# Patient Record
Sex: Female | Born: 1985 | Hispanic: Yes | Marital: Single | Smoking: Never smoker
Health system: Southern US, Community
[De-identification: ages and names within clinical notes are randomized; demographics above are authoritative.]

## PROBLEM LIST (undated history)

## (undated) DIAGNOSIS — T7840XA Allergy, unspecified, initial encounter: Secondary | ICD-10-CM

## (undated) DIAGNOSIS — D649 Anemia, unspecified: Secondary | ICD-10-CM

## (undated) DIAGNOSIS — N926 Irregular menstruation, unspecified: Secondary | ICD-10-CM

## (undated) DIAGNOSIS — A64 Unspecified sexually transmitted disease: Secondary | ICD-10-CM

## (undated) DIAGNOSIS — L659 Nonscarring hair loss, unspecified: Secondary | ICD-10-CM

## (undated) HISTORY — DX: Allergy, unspecified, initial encounter: T78.40XA

## (undated) HISTORY — DX: Anemia, unspecified: D64.9

## (undated) HISTORY — DX: Nonscarring hair loss, unspecified: L65.9

## (undated) HISTORY — DX: Unspecified sexually transmitted disease: A64

## (undated) HISTORY — DX: Irregular menstruation, unspecified: N92.6

---

## 2010-05-01 ENCOUNTER — Emergency Department: Payer: Self-pay | Admitting: Internal Medicine

## 2011-12-14 ENCOUNTER — Ambulatory Visit (INDEPENDENT_AMBULATORY_CARE_PROVIDER_SITE_OTHER): Payer: BC Managed Care – PPO | Admitting: Physician Assistant

## 2011-12-14 VITALS — BP 116/72 | HR 65 | Temp 98.1°F | Resp 16 | Ht 64.0 in | Wt 145.0 lb

## 2011-12-14 DIAGNOSIS — Z111 Encounter for screening for respiratory tuberculosis: Secondary | ICD-10-CM

## 2011-12-14 DIAGNOSIS — Z0000 Encounter for general adult medical examination without abnormal findings: Secondary | ICD-10-CM

## 2011-12-14 LAB — POCT CBC
Granulocyte percent: 61.5 %G (ref 37–80)
HCT, POC: 43.4 % (ref 37.7–47.9)
Hemoglobin: 13.4 g/dL (ref 12.2–16.2)
Lymph, poc: 3.2 (ref 0.6–3.4)
MCH, POC: 28.3 pg (ref 27–31.2)
MCHC: 30.9 g/dL — AB (ref 31.8–35.4)
MCV: 91.6 fL (ref 80–97)
MID (cbc): 0.5 (ref 0–0.9)
MPV: 9 fL (ref 0–99.8)
POC Granulocyte: 5.9 (ref 2–6.9)
POC LYMPH PERCENT: 32.9 %L (ref 10–50)
POC MID %: 5.6 %M (ref 0–12)
Platelet Count, POC: 476 10*3/uL — AB (ref 142–424)
RBC: 4.74 M/uL (ref 4.04–5.48)
RDW, POC: 14 %
WBC: 9.6 10*3/uL (ref 4.6–10.2)

## 2011-12-14 NOTE — Progress Notes (Signed)
PPD Placement note  Krista [name], 26 y.o. female is here today for placement of PPD test  Reason for PPD test: pre-employment  1. Have you ever had a TB Skin Test?: yes  2. Have you had a TB Skin Test in the past 6 weeks? no  If so, Date of Test n/a  3. Have you ever had a positive reaction? no  If yes, were you treated with INH? not applicable   4. Have you ever had a BCG vaccine? no  5. Have you ever had Tuberculosis? no  6. Have you been in recent contact with anyone known or suspected of having active TB disease?: no      Date of exposure (if applicable): n/a       Name of person they were exposed to (if applicable): n/a  Patient's Country of origin?: n/a  7. Have you ever been advised by a healthcare provider not to have a TB skin test? no  8. Are yout taking any oral or IV steroid medication now or have you taken it in the last month? no  Verified in allergy area and with patient that the patient is not allergic to the products PPD is made of (Phenol or Tween). Yes   PPD placed on 12/14/2011 at 6:09 PM by Faythe Ghee, CMA. Patient advised to return for reading within 48-72 hours.

## 2011-12-14 NOTE — Progress Notes (Signed)
  Subjective:    Patient ID: Krista [name], female    DOB: Jul 22, 1985, 26 y.o.   MRN: [id number]  HPI 26 year old here for CPE and form completion. 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 She is a healthy female with no known medical problems. No daily medications. She has not had a physical or been to the doctor in "years."  Also interested in being referred to gynecology for annual pap testing.  No concerns or complaints today.     Review of Systems  All other systems reviewed and are negative.       Objective:   Physical Exam  Constitutional: She is oriented to person, place, and time. She appears well-developed and well-nourished.  HENT:  Head: Normocephalic and atraumatic.  Right Ear: Hearing, tympanic membrane, external ear and ear canal normal.  Left Ear: Hearing, tympanic membrane, external ear and ear canal normal.  Mouth/Throat: Uvula is midline, oropharynx is clear and moist and mucous membranes are normal.  Eyes: Conjunctivae normal are normal.  Neck: Normal range of motion. No thyromegaly present.  Cardiovascular: Normal rate, regular rhythm and normal heart sounds.   Pulmonary/Chest: Effort normal and breath sounds normal.  Abdominal: Soft. Bowel sounds are normal. There is no tenderness. There is no rebound and no guarding.  Musculoskeletal: Normal range of motion.  Lymphadenopathy:    She has no cervical adenopathy.  Neurological: She is alert and oriented to person, place, and time.  Psychiatric: She has a normal mood and affect. Her behavior is normal. Judgment and thought content normal.          Assessment & Plan:   1. Routine general medical examination at a health care facility  POCT CBC, Comprehensive metabolic panel, Lipid panel, TSH  2. Screening for tuberculosis  TB Skin Test   TB test placed. Return in 48-72 hours for TB read Refer to GYN for annual pap testing Labwork sent

## 2011-12-14 NOTE — Progress Notes (Deleted)
PPD Placement note  Krista [name], 26 y.o. female is here today for placement of PPD test  Reason for PPD test: pre-employment  1. Have you ever had a TB Skin Test?: yes  2. Have you had a TB Skin Test in the past 6 weeks? {yes no:314532}  If so, Date of Test {NA AND WILDCARD:21589}  3. Have you ever had a positive reaction? {yes no:314532}  If yes, were you treated with INH? {Response; yes/no/na:63}   4. Have you ever had a BCG vaccine? {yes no:314532}  5. Have you ever had Tuberculosis? {yes no:314532}  6. Have you been in recent contact with anyone known or suspected of having active TB disease?: {yes no:314489}      Date of exposure (if applicable): {NA AND ZOXWRUEA:54098}       Name of person they were exposed to (if applicable): {NA AND JXBJYNWG:95621}  Patient's Country of origin?: {NA AND WILDCARD:21589}  7. Have you ever been advised by a healthcare provider not to have a TB skin test? {YES NO:22349}  8. Are yout taking any oral or IV steroid medication now or have you taken it in the last month? {yes HY:865784}  Verified in allergy area and with patient that the patient is not allergic to the products PPD is made of (Phenol or Tween). {yes no:315493::"Yes"}   PPD placed on 12/14/2011 at 5:54 PM. Patient advised to return for reading within 48-72 hours.

## 2011-12-15 LAB — COMPREHENSIVE METABOLIC PANEL
ALT: 8 U/L (ref 0–35)
AST: 10 U/L (ref 0–37)
Albumin: 4.7 g/dL (ref 3.5–5.2)
Alkaline Phosphatase: 78 U/L (ref 39–117)
BUN: 18 mg/dL (ref 6–23)
CO2: 23 mEq/L (ref 19–32)
Calcium: 9.5 mg/dL (ref 8.4–10.5)
Chloride: 104 mEq/L (ref 96–112)
Creat: 0.72 mg/dL (ref 0.50–1.10)
Glucose, Bld: 93 mg/dL (ref 70–99)
Potassium: 4.2 mEq/L (ref 3.5–5.3)
Sodium: 139 mEq/L (ref 135–145)
Total Bilirubin: 0.4 mg/dL (ref 0.3–1.2)
Total Protein: 7.6 g/dL (ref 6.0–8.3)

## 2011-12-15 LAB — TSH: TSH: 1.494 u[IU]/mL (ref 0.350–4.500)

## 2011-12-15 LAB — LIPID PANEL
Cholesterol: 121 mg/dL (ref 0–200)
HDL: 53 mg/dL (ref >39)
LDL Cholesterol: 56 mg/dL (ref 0–99)
Total CHOL/HDL Ratio: 2.3 Ratio
Triglycerides: 60 mg/dL (ref <150)
VLDL: 12 mg/dL (ref 0–40)

## 2011-12-16 ENCOUNTER — Ambulatory Visit (INDEPENDENT_AMBULATORY_CARE_PROVIDER_SITE_OTHER): Payer: BC Managed Care – PPO | Admitting: Radiology

## 2011-12-16 DIAGNOSIS — Z111 Encounter for screening for respiratory tuberculosis: Secondary | ICD-10-CM

## 2011-12-16 LAB — TB SKIN TEST
Induration: 0 mm
TB Skin Test: NEGATIVE

## 2012-07-27 ENCOUNTER — Encounter: Payer: Self-pay | Admitting: Obstetrics and Gynecology

## 2012-07-27 ENCOUNTER — Ambulatory Visit (INDEPENDENT_AMBULATORY_CARE_PROVIDER_SITE_OTHER): Payer: PRIVATE HEALTH INSURANCE | Admitting: Obstetrics and Gynecology

## 2012-07-27 VITALS — BP 100/70 | Ht 63.0 in | Wt 141.0 lb

## 2012-07-27 DIAGNOSIS — Z113 Encounter for screening for infections with a predominantly sexual mode of transmission: Secondary | ICD-10-CM

## 2012-07-27 DIAGNOSIS — Z23 Encounter for immunization: Secondary | ICD-10-CM

## 2012-07-27 DIAGNOSIS — Z01419 Encounter for gynecological examination (general) (routine) without abnormal findings: Secondary | ICD-10-CM

## 2012-07-27 DIAGNOSIS — N852 Hypertrophy of uterus: Secondary | ICD-10-CM

## 2012-07-27 DIAGNOSIS — Z0000 Encounter for general adult medical examination without abnormal findings: Secondary | ICD-10-CM

## 2012-07-27 LAB — POCT URINALYSIS DIPSTICK
Bilirubin, UA: NEGATIVE
Leukocytes, UA: NEGATIVE
Nitrite, UA: NEGATIVE
Protein, UA: NEGATIVE
pH, UA: 5

## 2012-07-27 MED ORDER — NORETHIN ACE-ETH ESTRAD-FE 1-20 MG-MCG PO TABS: 1.0000 | ORAL_TABLET | Freq: Every day | ORAL | Status: DC

## 2012-07-27 NOTE — Progress Notes (Signed)
Patient ID: Krista [name], female   DOB: 04-Mar-1986, 27 y.o.   MRN: [id number] 27 y.o.   Single    Hispanic   female   G0P0   here for annual exam.   Wants birth control.   Used condoms in the past.  Monogamous relationship. Was in previous long term relationship for 8 years and did not get pregnant despite the fact that she did not always use condoms.  No problems with menses.  First 2 days heavier with cramps. Changes every 2 - 3 hours.   Has a PCP at [facility]  Had physcial exam in September 2013 - normal.  Had normal blood work.  Patient's last menstrual period was 07/04/2012.          Sexually active: yes  The current method of family planning is condoms sometimes.    Exercising: cardio and weight lifting Last mammogram: never   Last pap smear: never History of abnormal pap: n/a Smoking:no Alcohol: 2 alcoholic drinks per week Last colonoscopy: never Last Bone Density: never  Last tetanus shot:2012 Did not have Gardisil vaccine.  Last cholesterol check: last year.    Hgb:                Urine: neg   Family History  Problem Relation Age of Onset  . Diabetes Maternal Grandfather     There are no active problems to display for this patient.   PMH - Anemia.  History reviewed. No pertinent past surgical history.  Allergies: Shellfish allergy  No current outpatient prescriptions on file.   No current facility-administered medications for this visit.    ROS: Pertinent items are noted in HPI.  Social Hx:  Single.  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  Lives with mom in [place].  Exam:    BP 100/70  Ht 5\' 3"  (1.6 m)  Wt 141 lb (63.957 kg)  BMI 24.98 kg/m2  LMP 07/04/2012   Wt Readings from Last 3 Encounters:  07/27/12 141 lb (63.957 kg)  12/14/11 145 lb (65.772 kg)     Ht Readings from Last 3 Encounters:  07/27/12 5\' 3"  (1.6 m)  12/14/11 5\' 4"  (1.626 m)    General appearance: alert, cooperative and appears stated age Head: Normocephalic, without obvious  abnormality, atraumatic Neck: no adenopathy, supple, symmetrical, trachea midline and thyroid not enlarged, symmetric, no tenderness/mass/nodules.  Small 1.0 cm nontender left cervical node. Lungs: clear to auscultation bilaterally Breasts: Inspection negative, No nipple retraction or dimpling, No nipple discharge or bleeding, No axillary or supraclavicular adenopathy, Normal to palpation without dominant masses Heart: regular rate and rhythm Abdomen: soft, non-tender; bowel sounds normal; no masses,  no organomegaly Extremities: extremities normal, atraumatic, no cyanosis or edema Skin: Skin color, texture, turgor normal. No rashes or lesions Lymph nodes: Cervical, supraclavicular, and axillary nodes normal. No abnormal inguinal nodes palpated Neurologic: Grossly normal   Pelvic: External genitalia:  no lesions              Urethra:  normal appearing urethra with no masses, tenderness or lesions              Bartholins and Skenes: normal                 Vagina: normal appearing vagina with normal color and discharge, no lesions              Cervix: normal appearance              Pap taken: yes and reflex  HPV testing.        Bimanual Exam:  Uterus:  uterus is globular and 7 week size, nontender                                      Adnexa: normal adnexa in size, nontender and no masses                                      Rectovaginal: Confirms                                      Anus:  normal sphincter tone, no lesions  A:  Enlarged uterus.  I suspect fibroids. No prior HPV vaccine. No prior pap. Need for contraception.     P: Taught breast self awareness. Pap and reflex HPV. STD testing - HIV, RPR, Hep C, and HBsAg, GC/CT Discussed safe sex Gardasil vaccine series. LoEstrin 1/20.  Patient instructed in use and side effects/risks/benefits. Return for pelvic ultrasound return annually or prn     An After Visit Summary was printed and given to the patient.

## 2012-07-27 NOTE — Patient Instructions (Addendum)
EXERCISE AND DIET:  We recommended that you start or continue a regular exercise program for good health. Regular exercise means any activity that makes your heart beat faster and makes you sweat.  We recommend exercising at least 30 minutes per day at least 3 days a week, preferably 4 or 5.  We also recommend a diet low in fat and sugar.  Inactivity, poor dietary choices and obesity can cause diabetes, heart attack, stroke, and kidney damage, among others.    ALCOHOL AND SMOKING:  Women should limit their alcohol intake to no more than 7 drinks/beers/glasses of wine (combined, not each!) per week. Moderation of alcohol intake to this level decreases your risk of breast cancer and liver damage. And of course, no recreational drugs are part of a healthy lifestyle.  And absolutely no smoking or even second hand smoke. Most people know smoking can cause heart and lung diseases, but did you know it also contributes to weakening of your bones? Aging of your skin?  Yellowing of your teeth and nails?  CALCIUM AND VITAMIN D:  Adequate intake of calcium and Vitamin D are recommended.  The recommendations for exact amounts of these supplements seem to change often, but generally speaking 600 mg of calcium (either carbonate or citrate) and 800 units of Vitamin D per day seems prudent. Certain women may benefit from higher intake of Vitamin D.  If you are among these women, your doctor will have told you during your visit.    PAP SMEARS:  Pap smears, to check for cervical cancer or precancers,  have traditionally been done yearly, although recent scientific advances have shown that most women can have pap smears less often.  However, every woman still should have a physical exam from her gynecologist every year. It will include a breast check, inspection of the vulva and vagina to check for abnormal growths or skin changes, a visual exam of the cervix, and then an exam to evaluate the size and shape of the uterus and  ovaries.  And after 25 years of age, a rectal exam is indicated to check for rectal cancers. We will also provide age appropriate advice regarding health maintenance, like when you should have certain vaccines, screening for sexually transmitted diseases, bone density testing, colonoscopy, mammograms, etc.   MAMMOGRAMS:  All women over 40 years old should have a yearly mammogram. Many facilities now offer a "3D" mammogram, which may cost around $50 extra out of pocket. If possible,  we recommend you accept the option to have the 3D mammogram performed.  It both reduces the number of women who will be called back for extra views which then turn out to be normal, and it is better than the routine mammogram at detecting truly abnormal areas.    COLONOSCOPY:  Colonoscopy to screen for colon cancer is recommended for all women at age 50.  We know, you hate the idea of the prep.  We agree, BUT, having colon cancer and not knowing it is worse!!  Colon cancer so often starts as a polyp that can be seen and removed at colonscopy, which can quite literally save your life!  And if your first colonoscopy is normal and you have no family history of colon cancer, most women don't have to have it again for 10 years.  Once every ten years, you can do something that may end up saving your life, right?  We will be happy to help you get it scheduled when you are ready.    Be sure to check your insurance coverage so you understand how much it will cost.  It may be covered as a preventative service at no cost, but you should check your particular policy.     Oral Contraception Information Oral contraceptives (OCs) are medicines taken to prevent pregnancy. OCs work by preventing the ovaries from releasing eggs. The hormones in OCs also cause the cervical mucus to thicken, preventing the sperm from entering the uterus. The hormones also cause the uterine lining to become thin, not allowing a fertilized egg to attach to the inside of  the uterus. OCs are highly effective when taken exactly as prescribed. However, OCs do not prevent sexually transmitted diseases (STDs). Safe sex practices, such as using condoms along with the pill, can help prevent STDs.  Before taking the pill, you may have a physical exam and Pap test. Your caregiver may order blood tests that may be necessary. Your caregiver will make sure you are a good candidate for oral contraception. Discuss with your caregiver the possible side effects of the OC you may be prescribed. When starting an OC, it can take 2 to 3 months for the body to adjust to the changes in hormone levels in your body.  TYPES OF ORAL CONTRACEPTION  The combination pill. This pill contains estrogen and progestin (synthetic progesterone) hormones. The combination pill comes in either 21-day or 28-day packs. With 21-day packs, you do not take pills for 7 days after the last pill. With 28-day packs, the pill is taken every day. The last 7 pills are without hormones. Certain types of pills have more than 21 hormone-containing pills.  The minipill. This pill contains the progesterone hormone only. It is taken every day continuously. The minipill comes in packs of 91 pills. The first 84 pills contain the hormones, and the last 7 pills do not. The last 7 days are when you will have your menstrual period. You may experience irregular spotting. ADVANTAGES  Decreases premenstrual symptoms.  Treats menstrual period cramps.  Regulates the menstrual cycle.  Decreases a heavy menstrual flow.  Treats acne.  Treats abnormal uterine bleeding.  Treats chronic pelvic pain.  Treats polycystic ovarian syndrome.  Treats endometriosis.  Can be used as emergency contraception. DISADVANTAGES OCs can be less effective if:  You forget to take the pill at the same time every day.  You have a stomach or intestinal disease that lessens the absorption of the pill.  You take OCs with other medicines that  make OCs less effective.  You take expired OCs.  You forget to restart the pill on day 7, when using the packs of 21 pills. Document Released: 05/22/2002 Document Revised: 05/24/2011 Document Reviewed: 07/08/2010 [facility] Patient Information 2013 [place], [place]. HPV Vaccine Questions and Answers WHAT IS HUMAN PAPILLOMAVIRUS (HPV)? HPV is a virus that can lead to cervical cancer; vulvar and vaginal cancers; penile cancer; anal cancer and genital warts (warts in the genital areas). More than 1 vaccine is available to help you or your child with protection against HPV. Your caregiver can talk to you about which one might give you the best protection. WHO SHOULD GET THIS VACCINE? The HPV vaccine is most effective when given before the onset of sexual activity.  This vaccine is recommended for girls 82 or 25 years of age. It can be given to girls as young as 25 years old.  HPV vaccine can be given to males, 9 through 25 years of age, to reduce the likelihood of acquiring  genital warts.  HPV vaccine can be given to males and females aged 6 through 26 years to prevent anal cancer. HPV vaccine is not generally recommended after age 32, because most individuals have been exposed to the HPV virus by that age. HOW EFFECTIVE IS THIS VACCINE?  The vaccine is generally effective in preventing cervical; vulvar and vaginal cancers; penile cancer; anal cancer and genital warts caused by 4 types of HPV. The vaccine is less effective in those individuals who are already infected with HPV. This vaccine does not treat existing HPV, genital warts, pre-cancers or cancers. WILL SEXUALLY ACTIVE INDIVIDUALS BENEFIT FROM THE VACCINE? Sexually active individuals may still benefit from the vaccine but may get less benefit due to previous HPV exposure. HOW AND WHEN IS THE VACCINE ADMINISTERED? The vaccine is given in a series of 3 injections (shots) over a 6 month period in both males and females. The exact timing  depends on which specific vaccine your caregiver recommends for you. IS THE HPV VACCINE SAFE?  The federal government has approved the HPV vaccine as safe and effective. This vaccine was tested in both males and females in many countries around the world. The most common side effect is soreness at the injection site. Since the drug became approved, there has been some concern about patients passing out after being vaccinated, which has led to a recommendation of a 15 minute waiting period following vaccination. This practice may decrease the small risk of passing out. Additionally there is a rare risk of anaphylaxis (an allergic reaction) to the vaccine and a risk of a blood clot among individuals with specific risk factors for a blood clot. DOES THIS VACCINE CONTAIN THIMEROSAL OR MERCURY? No. There is no thimerosal or mercury in the HPV vaccine. It is made of proteins from the outer coat of the virus (HPV). There is no infectious material in this vaccine. WILL GIRLS/WOMEN WHO HAVE BEEN VACCINATED STILL NEED CERVICAL CANCER SCREENING? Yes. There are 3 reasons why women will still need regular cervical cancer screening. First, the vaccine will NOT provide protection against all types of HPV that cause cervical cancer. Vaccinated women will still be at risk for some cancers. Second, some women may not get all required doses of the vaccine (or they may not get them at the recommended times). Therefore, they may not get the vaccine's full benefits. Third, women may not get the full benefit of the vaccine if they receive it after they have already acquired any of the 4 types of HPV. WILL THE HPV VACCINE BE COVERED BY INSURANCE PLANS? While some insurance companies may cover the vaccine, others may not. Most large group insurance plans cover the costs of recommended vaccines. WHAT KIND OF GOVERNMENT PROGRAMS MAY BE AVAILABLE TO COVER HPV VACCINE? Federal health programs such as Vaccines for Children [facility]) will  cover the HPV vaccine. The [facility] program provides free vaccines to children and adolescents under 63 years of age, who are either uninsured, Medicaid-eligible, American [place] or [place] Native. There are over 45,000 sites that provide [facility] vaccines including hospital, private and public clinics. The [facility] program also allows children and adolescents to get [facility] vaccines through [facility] if their private health insurance does not cover the vaccine. Some states also provide free or low-cost vaccines, at public health clinics, to people without health insurance coverage for vaccines. GENITAL HPV: WHY IS HPV IMPORTANT? Genital HPV is the most common virus transmitted through genital contact, most  often during vaginal and anal sex. About 40 types of HPV can infect the genital areas of men and women. While most HPV types cause no symptoms and go away on their own, some types can cause cervical cancer in women. These types also cause other less common genital cancers, including cancers of the penis, anus, vagina (birth canal), and vulva (area around the opening of the vagina). Other types of HPV can cause genital warts in men and women. HOW COMMON IS HPV?   At least 50% of sexually active people will get HPV at some time in their lives. HPV is most common in young women and men who are in their late teens and early 72s.  Anyone who has ever had genital contact with another person can get HPV. Both men and women can get it and pass it on to their sex partners without realizing it. IS HPV THE SAME THING AS HIV OR HERPES? HPV is NOT the same as HIV or Herpes (Herpes simplex virus or HSV). While these are all viruses that can be sexually transmitted, HIV and HSV do not cause the same symptoms or health problems as HPV. CAN HPV AND ITS ASSOCIATED DISEASES BE TREATED? There is no treatment for HPV. There are treatments for the health problems that HPV can cause, such as  genital warts, cervical cell changes, and cancers of the cervix (lower part of the womb), vulva, vagina and anus.  HOW IS HPV RELATED TO CERVICAL CANCER? Some types of HPV can infect a woman's cervix and cause the cells to change in an abnormal way. Most of the time, HPV goes away on its own. When HPV is gone, the cervical cells go back to normal. Sometimes, HPV does not go away. Instead, it lingers (persists) and continues to change the cells on a woman's cervix. These cell changes can lead to cancer over time if they are not treated. ARE THERE OTHER WAYS TO PREVENT CERVICAL CANCER? Regular Pap tests and follow-up can prevent most, but not all, cases of cervical cancer. Pap tests can detect cell changes (or pre-cancers) in the cervix before they turn into cancer. Pap tests can also detect most, but not all, cervical cancers at an early, curable stage. Most women diagnosed with cervical cancer have either never had a Pap test, or not had a Pap test in the last 5 years. There is also an HPV DNA test available for use with the Pap test as part of cervical cancer screening. This test may be ordered for women over 30 or for women who get an unclear (borderline) Pap test result. While this test can tell if a woman has HPV on her cervix, it cannot tell which types of HPV she has. If the HPV DNA test is negative for HPV DNA, then screening may be done every 3 years. If the HPV DNA test is positive for HPV DNA, then screening should be done every 6 to 12 months. OTHER QUESTIONS ABOUT THE HPV VACCINE WHAT HPV TYPES DOES THE VACCINE PROTECT AGAINST? The HPV vaccine protects against the HPV types that cause most (70%) cervical cancers (types 16 and 18), most (78%) anal cancers (types 16 and 18) and the two HPV types that cause most (90%) genital warts (types 6 and 11). WHAT DOES THE VACCINE NOT PROTECT AGAINST?  Because the vaccine does not protect against all types of HPV, it will not prevent all cases of cervical  cancer, anal cancer, other genital cancers or genital warts. About  30% of cervical cancers are not prevented with vaccination, so it will be important for women to continue screening for cervical cancer (regular Pap tests). Also, the vaccine does not prevent about 10% of genital warts nor will it prevent other sexually transmitted infections (STIs), including HIV. Therefore, it will still be important for sexually active adults to practice safe sex to reduce exposure to HPV and other STI's. HOW LONG DOES VACCINE PROTECTION LAST? WILL A BOOSTER SHOT BE NEEDED? So far, studies have followed women for 5 years and found that they are still protected. Currently, additional (booster) doses are not recommended. More research is being done to find out how long protection will last, and if a booster vaccine is needed years later.  WHY IS THE HPV VACCINE RECOMMENDED AT SUCH A YOUNG AGE? Ideally, males and females should get the vaccine before they are sexually active since this vaccine is most effective in individuals who have not yet acquired any of the HPV vaccine types. Individuals who have not been infected with any of the 4 types of HPV will get the full benefits of the vaccine.  SHOULD PREGNANT WOMEN BE VACCINATED? The vaccine is not recommended for pregnant women. There has been limited research looking at vaccine safety for pregnant women and their developing fetus. Studies suggest that the vaccine has not caused health problems during pregnancy, nor has it caused health problems for the infant. Pregnant women should complete their pregnancy before getting the vaccine. If a woman finds out she is pregnant after she has started getting the vaccine series, she should complete her pregnancy before finishing the 3 doses. SHOULD BREASTFEEDING MOTHERS BE VACCINATED? Mothers nursing their babies may get the vaccine because the virus is inactivated and will not harm the mother or baby. WILL INDIVIDUALS BE PROTECTED  AGAINST HPV AND RELATED DISEASES, EVEN IF THEY DO NOT GET ALL 3 DOSES? It is not yet known how much protection individuals will get from receiving only 1 or 2 doses of the vaccine. For this reason, it is very important that individuals get all 3 doses of the vaccine. WILL CHILDREN BE REQUIRED TO BE VACCINATED TO ENTER SCHOOL? There are no federal laws that require children or adolescents to get vaccinated. All school entry laws are state laws so they vary from state to state. To find out what vaccines are needed for children or adolescents to enter school in your state, check with your state health department or board of education. ARE THERE OTHER WAYS TO PREVENT HPV? The only sure way to prevent HPV is to abstain from all sexual activity. Sexually active adults can reduce their risk by being in a mutually monogamous relationship with someone who has had no other sex partners. But even individuals with only 1 lifetime sex partner can get HPV, if their partner has had a previous partner with HPV. It is unknown how much protection condoms provide against HPV, since areas that are not covered by a condom can be exposed to the virus. However, condoms may reduce the risk of genital warts and cervical cancer. They can also reduce the risk of HIV and some other sexually transmitted infections (STIs), when used consistently and correctly (all the time and the right way). Document Released: 03/01/2005 Document Revised: 05/24/2011 Document Reviewed: 10/25/2008 [facility] Patient Information 2013 [place], [place].

## 2012-07-28 ENCOUNTER — Encounter: Payer: Self-pay | Admitting: Obstetrics and Gynecology

## 2012-07-28 NOTE — Progress Notes (Signed)
Patient tolerated injection well.

## 2012-08-10 ENCOUNTER — Telehealth: Payer: Self-pay | Admitting: Obstetrics and Gynecology

## 2012-08-14 ENCOUNTER — Telehealth: Payer: Self-pay | Admitting: Obstetrics and Gynecology

## 2012-08-14 NOTE — Telephone Encounter (Signed)
Scheduled patient for PUS. 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 Please call the patient.

## 2012-08-15 NOTE — Telephone Encounter (Signed)
Spoke with pt about HPV result. 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 She has mild cellular changes that do not require any further treatment except to have a Pap next year. Advised her body would most likely fight off the infection on its own. Pt appreciative.

## 2012-08-29 NOTE — Telephone Encounter (Signed)
Unable to leave message in CB#VM box not set up.

## 2012-08-29 NOTE — Telephone Encounter (Signed)
Patient has an ultrasound appointment tomorrow but has started her cycle and needs to know if she should keep the appointment or reschedule?

## 2012-08-30 ENCOUNTER — Encounter: Payer: Self-pay | Admitting: Obstetrics and Gynecology

## 2012-08-30 ENCOUNTER — Ambulatory Visit (INDEPENDENT_AMBULATORY_CARE_PROVIDER_SITE_OTHER): Payer: PRIVATE HEALTH INSURANCE | Admitting: Obstetrics and Gynecology

## 2012-08-30 ENCOUNTER — Ambulatory Visit (INDEPENDENT_AMBULATORY_CARE_PROVIDER_SITE_OTHER): Payer: PRIVATE HEALTH INSURANCE

## 2012-08-30 VITALS — BP 104/60 | HR 60 | Ht 63.0 in | Wt 140.0 lb

## 2012-08-30 DIAGNOSIS — R6889 Other general symptoms and signs: Secondary | ICD-10-CM

## 2012-08-30 DIAGNOSIS — N852 Hypertrophy of uterus: Secondary | ICD-10-CM

## 2012-08-30 DIAGNOSIS — N921 Excessive and frequent menstruation with irregular cycle: Secondary | ICD-10-CM

## 2012-08-30 DIAGNOSIS — IMO0002 Reserved for concepts with insufficient information to code with codable children: Secondary | ICD-10-CM

## 2012-08-30 NOTE — Patient Instructions (Signed)
Abnormal Pap Test Information During a Pap test, the cells on the surface of your cervix are checked to see if they look normal, abnormal, or if they show signs of having been altered by a certain type of virus called human papillomavirus, or HPV. Cervical cells that have been affected by HPV are called dysplasia. Dysplasia is not cancer, but describes abnormal cells found on the surface of the cervix. Depending on the degree of dysplasia, some of the cells may be considered pre-cancerous and may turn into cancer over time if follow up with a caregiver is delayed.  WHAT DOES AN ABNORMAL PAP TEST MEAN? Having an abnormal pap test does not mean that you have cancer. However, certain types of abnormal pap tests can be a sign that a person is at a higher risk of developing cancer. Your caregiver will want to do other tests to find out more about the abnormal cells. Your abnormal Pap test results could show:   Small and uncertain changes that should be carefully watched.   Cervical dysplasia that has caused mild changes and can be followed over time.  Cervical dysplasia that is more severe and needs to be followed and treated to ensure the problem goes away.  Cancer.  When severe cervical dysplasia is found and treated early, it rarely will grow into cancer.  WHAT WILL BE DONE ABOUT MY ABNORMAL PAP TEST?  A colposcopy may be needed. This is a procedure where your cervix is examined using light and magnification.  A small tissue sample of your cervix (biopsy) may need to be removed and then examined. This is often performed if there are areas that appear infected.  A sample of cells from the cervical canal may be removed with either a small brush or scraping instrument (curette). Based on the results of the procedures above, some caregivers may recommend either cryotherapy of the cervix or a surgical LEEP where a portion of the cervix is removed. LEEP is short for "loop electrical excisional  procedure." Rarely, a caregiver may recommend a cone biopsy.This is a procedure where a small, cone-shaped sample of your cervix is taken out. The part that is taken out is the area where the abnormal cells are.  WHAT IF I HAVE A DYSPLASIA OR A CANCER? You may be referred to a specialist. Radiation may also be a treatment for more advanced cancer. Having a hysterectomy is the last treatment option for dysplasia, but it is a more common treatment for someone with cancer. All treatment options will be discussed with you by your caregiver. WHAT SHOULD YOU DO AFTER BEING TREATED? If you have had an abnormal pap test, you should continue to have regular pap tests and check-ups as directed by your caregiver. Your cervical problem will be carefully watched so it does not get worse. Also, your caregiver can watch for, and treat, any new problems that may come up. Document Released: 06/16/2010 Document Revised: 05/24/2011 Document Reviewed: 02/25/2011 [employer] Patient Information 2014 [employer].  

## 2012-08-30 NOTE — Progress Notes (Signed)
Subjective  Here for ultrasound today for follow up of enlarged uterus noted on routine pelvic exam at time of annual visit.  Having break through bleeding on LoEstrin 1/20.  Generally happy with being on the OCPS.  No missed pills.  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  Objective  Normal pelvic ultrasound noted.  No uterine or andexal masses seen.    Assessment  Normal pelvic ultrasound. Breakthrough bleeding on LoEstrin 1/20. LGSIL pap.  Plan  Reassurance regarding pelvic ultrasound results. Monitor bleeding on OCPs.  If persists after three months, will switch to LoEstrin 1.5/30. Discussion with patient regarding HPV, Gardasil, and abnormal pap smears. Next Gardasil in one month. Pap repeat in one year.  After visit summary to patient.

## 2012-09-29 ENCOUNTER — Ambulatory Visit (INDEPENDENT_AMBULATORY_CARE_PROVIDER_SITE_OTHER): Payer: PRIVATE HEALTH INSURANCE | Admitting: *Deleted

## 2012-09-29 VITALS — BP 110/74 | HR 72 | Ht 63.0 in | Wt 144.0 lb

## 2012-09-29 DIAGNOSIS — Z23 Encounter for immunization: Secondary | ICD-10-CM

## 2012-09-29 NOTE — Progress Notes (Signed)
Pt arrived for 2nd Gardasil injection.  Pt had no complaints after 1st injection.  Pt tolerated injection well in left deltoid.  She will return in November 2014 for third and final injection.

## 2012-12-13 DIAGNOSIS — A64 Unspecified sexually transmitted disease: Secondary | ICD-10-CM

## 2012-12-13 HISTORY — DX: Unspecified sexually transmitted disease: A64

## 2013-01-10 ENCOUNTER — Encounter: Payer: Self-pay | Admitting: Obstetrics and Gynecology

## 2013-01-10 ENCOUNTER — Ambulatory Visit: Payer: PRIVATE HEALTH INSURANCE | Admitting: Obstetrics and Gynecology

## 2013-01-10 DIAGNOSIS — B9689 Other specified bacterial agents as the cause of diseases classified elsewhere: Secondary | ICD-10-CM

## 2013-01-10 DIAGNOSIS — N760 Acute vaginitis: Secondary | ICD-10-CM

## 2013-01-10 DIAGNOSIS — Z113 Encounter for screening for infections with a predominantly sexual mode of transmission: Secondary | ICD-10-CM

## 2013-01-10 DIAGNOSIS — A499 Bacterial infection, unspecified: Secondary | ICD-10-CM

## 2013-01-10 MED ORDER — METRONIDAZOLE 500 MG PO TABS: 500.0000 mg | ORAL_TABLET | Freq: Two times a day (BID) | ORAL | Status: DC

## 2013-01-10 NOTE — Progress Notes (Signed)
Patient ID: Krista [name], female   DOB: [birth year], 27 y.o.   MRN: [id number] GYNECOLOGY PROBLEM VISIT  PCP:  None  Referring provider:   HPI: 27 y.o.   Single  Hispanic  female   G0P0 with LMP10-5-14.   here for light yellow vaginal discharge with odor for one week. No itching or burning.  Tried bubble baths after.  No new exposures prior to this.  New sexual partner. Using condoms.  Stopped birth control pills due to break through bleeding. Declines prescription contraception.   GYNECOLOGIC HISTORY: LMP 12-17-12 Sexually active:  yes Partner preference: female Contraception:   condoms Menopausal hormone therapy: n/a DES exposure:   no Blood transfusions:   no Sexually transmitted diseases:   HPV GYN Procedures:  no Mammogram: n/a                Pap: 07-27-12 LSIL   History of abnormal pap smear:  no   OB History   Grav Para Term Preterm Abortions TAB SAB Ect Mult Living   0                  Family History  Problem Relation Age of Onset  . Diabetes Maternal Grandfather     There are no active problems to display for this patient.   Past Medical History  Diagnosis Date  . Anemia     History reviewed. No pertinent past surgical history.  ALLERGIES: Shellfish allergy  No current outpatient prescriptions on file.   No current facility-administered medications for this visit.     ROS:  Pertinent items are noted in HPI.  SOCIAL HISTORY:    PHYSICAL EXAMINATION:    There were no vitals taken for this visit.   Wt Readings from Last 3 Encounters:  09/29/12 144 lb (65.318 kg)  08/30/12 140 lb (63.504 kg)  07/27/12 141 lb (63.957 kg)     Ht Readings from Last 3 Encounters:  09/29/12 5\' 3"  (1.6 m)  08/30/12 5\' 3"  (1.6 m)  07/27/12 5\' 3"  (1.6 m)    General appearance: alert, cooperative and appears stated age   Pelvic: External genitalia:  no lesions              Urethra:  normal appearing urethra with no masses, tenderness or lesions   Bartholins and Skenes: normal                 Vagina: normal appearing vagina with normal color and discharge, no lesions              Cervix: normal appearance                  Bimanual Exam:  Uterus:  uterus is normal size, shape, consistency and nontender                                      Adnexa: normal adnexa in size, nontender and no masses                                       Wet prep - pH 5.5, positive clue cells, negative yeast and trichomonas.  ASSESSMENT  Bacterial vaginosis. New partner. History of LGSIL pap.    PLAN  Flagyl 500 mg po bid for 7 days.  STD panel. Discussed Plan B for emergency  contraception.  Follow up prn and for annual visit in May 2014, at which time a pap will be performed.   An After Visit Summary was printed and given to the patient.

## 2013-01-10 NOTE — Patient Instructions (Signed)
Bacterial Vaginosis Bacterial vaginosis (BV) is a vaginal infection where the normal balance of bacteria in the vagina is disrupted. The normal balance is then replaced by an overgrowth of certain bacteria. There are several different kinds of bacteria that can cause BV. BV is the most common vaginal infection in women of childbearing age. CAUSES   The cause of BV is not fully understood. BV develops when there is an increase or imbalance of harmful bacteria.  Some activities or behaviors can upset the normal balance of bacteria in the vagina and put women at increased risk including:  Having a new sex partner or multiple sex partners.  Douching.  Using an intrauterine device (IUD) for contraception.  It is not clear what role sexual activity plays in the development of BV. However, women that have never had sexual intercourse are rarely infected with BV. Women do not get BV from toilet seats, bedding, swimming pools or from touching objects around them.  SYMPTOMS   Grey vaginal discharge.  A fish-like odor with discharge, especially after sexual intercourse.  Itching or burning of the vagina and vulva.  Burning or pain with urination.  Some women have no signs or symptoms at all. DIAGNOSIS  Your caregiver must examine the vagina for signs of BV. Your caregiver will perform lab tests and look at the sample of vaginal fluid through a microscope. They will look for bacteria and abnormal cells (clue cells), a pH test higher than 4.5, and a positive amine test all associated with BV.  RISKS AND COMPLICATIONS   Pelvic inflammatory disease (PID).  Infections following gynecology surgery.  Developing HIV.  Developing herpes virus. TREATMENT  Sometimes BV will clear up without treatment. However, all women with symptoms of BV should be treated to avoid complications, especially if gynecology surgery is planned. Female partners generally do not need to be treated. However, BV may spread  between female sex partners so treatment is helpful in preventing a recurrence of BV.   BV may be treated with antibiotics. The antibiotics come in either pill or vaginal cream forms. Either can be used with nonpregnant or pregnant women, but the recommended dosages differ. These antibiotics are not harmful to the baby.  BV can recur after treatment. If this happens, a second round of antibiotics will often be prescribed.  Treatment is important for pregnant women. If not treated, BV can cause a premature delivery, especially for a pregnant woman who had a premature birth in the past. All pregnant women who have symptoms of BV should be checked and treated.  For chronic reoccurrence of BV, treatment with a type of prescribed gel vaginally twice a week is helpful. HOME CARE INSTRUCTIONS   Finish all medication as directed by your caregiver.  Do not have sex until treatment is completed.  Tell your sexual partner that you have a vaginal infection. They should see their caregiver and be treated if they have problems, such as a mild rash or itching.  Practice safe sex. Use condoms. Only have 1 sex partner. PREVENTION  Basic prevention steps can help reduce the risk of upsetting the natural balance of bacteria in the vagina and developing BV:  Do not have sexual intercourse (be abstinent).  Do not douche.  Use all of the medicine prescribed for treatment of BV, even if the signs and symptoms go away.  Tell your sex partner if you have BV. That way, they can be treated, if needed, to prevent reoccurrence. SEEK MEDICAL CARE IF:     Your symptoms are not improving after 3 days of treatment.  You have increased discharge, pain, or fever. MAKE SURE YOU:   Understand these instructions.  Will watch your condition.  Will get help right away if you are not doing well or get worse. FOR MORE INFORMATION  Division of STD Prevention (DSTDP), Centers for Disease Control and Prevention:  www.cdc.gov/std American Social Health Association (ASHA): www.ashastd.org  Document Released: 03/01/2005 Document Revised: 05/24/2011 Document Reviewed: 08/22/2008 ExitCare Patient Information 2014 [employer].  

## 2013-01-11 LAB — HEPATITIS C ANTIBODY: HCV Ab: NEGATIVE

## 2013-01-13 LAB — GC/CHLAMYDIA PROBE AMP, URINE: GC Probe Amp, Urine: NEGATIVE

## 2013-01-18 ENCOUNTER — Telehealth: Payer: Self-pay | Admitting: Obstetrics and Gynecology

## 2013-01-18 ENCOUNTER — Other Ambulatory Visit: Payer: Self-pay

## 2013-01-18 ENCOUNTER — Telehealth: Payer: Self-pay

## 2013-01-18 ENCOUNTER — Other Ambulatory Visit: Payer: Self-pay | Admitting: Obstetrics and Gynecology

## 2013-01-18 MED ORDER — AZITHROMYCIN 500 MG PO TABS: ORAL_TABLET | ORAL | Status: DC

## 2013-01-18 NOTE — Telephone Encounter (Signed)
Message copied by Alphonsa Overall on Thu Jan 18, 2013 10:28 AM ------      Message from: Conley Simmonds      Created: Thu Jan 18, 2013  8:24 AM      Regarding: Please contact patient regarding positive chlamydia test       Marchelle Folks,            Please contact patient regarding her positive chlamydia test.      I will send an Rx to her pharmacy for Azithromycin 1 gram.            Thank you!!!            Brook  ------

## 2013-01-18 NOTE — Telephone Encounter (Signed)
Patient notified of positive Chlamydia culture and advised Dr. Edward Jolly Escribed Azithromycin for her.  She needs to have partner Checked and treated by his physician.  Made 3 month recheck appointment for patient to see Dr. Edward Jolly.

## 2013-01-19 ENCOUNTER — Encounter: Payer: Self-pay | Admitting: Obstetrics and Gynecology

## 2013-01-19 NOTE — Telephone Encounter (Signed)
Patient came to office and was paying bill. Had questions regarding Azithromycin dosage. Wanted to know if just taking two pills were correct. I advised, yes, two pills are what was needed for treatment. I printed out CDC information/fact sheet on treatment for patient. Discussed partner needing to be treated as well. Patient verbalized understanding and will follow up as needed.

## 2013-01-19 NOTE — Progress Notes (Signed)
Patient ID: Krista [name], female   DOB: 1985/03/20, 27 y.o.   MRN: [id number]  Patient came to office to pay bill and had questions regarding new medication.   Patient went to pick up zithromax rx and was wondering if two pills were correct dose.  I advised patient that

## 2013-01-22 NOTE — Telephone Encounter (Signed)
Patient had questions about Azithromycin and discussed this with French Ana our triage nurse on 01/19/13 (see note)

## 2013-01-30 ENCOUNTER — Ambulatory Visit (INDEPENDENT_AMBULATORY_CARE_PROVIDER_SITE_OTHER): Payer: PRIVATE HEALTH INSURANCE | Admitting: *Deleted

## 2013-01-30 VITALS — BP 100/60 | HR 76 | Resp 18 | Wt 145.0 lb

## 2013-01-30 DIAGNOSIS — Z23 Encounter for immunization: Secondary | ICD-10-CM

## 2013-04-19 ENCOUNTER — Encounter: Payer: Self-pay | Admitting: Obstetrics and Gynecology

## 2013-04-19 ENCOUNTER — Ambulatory Visit (INDEPENDENT_AMBULATORY_CARE_PROVIDER_SITE_OTHER): Payer: PRIVATE HEALTH INSURANCE | Admitting: Obstetrics and Gynecology

## 2013-04-19 VITALS — BP 113/73 | HR 65 | Resp 14 | Wt 151.0 lb

## 2013-04-19 DIAGNOSIS — A749 Chlamydial infection, unspecified: Secondary | ICD-10-CM

## 2013-04-19 NOTE — Patient Instructions (Signed)
Please return for your annual examination in May so we can do your repeat pap smear!

## 2013-04-19 NOTE — Progress Notes (Signed)
Patient ID: Krista [name], female   DOB: Jul 07, 1985, 28 y.o.   MRN: [id number]   Subjective  Patient is here for a recheck. Positive chlamydia on 01/10/13. Treated with Azithromycin.  Was able to tolerate.  No abnormal bleeding, pain, discharge, or fever.  Partner treated as well.  Patient had full STD check in October 2014.   History of LGSIL pap in May 2014.  Due for a recheck in May 2015.   Stopped LoEstrin 1/20 for birth control.  Using condoms.  Patient is a Pharmacist, hospital.   Objective  Pelvic examination - Normal external genitalia and urethra. Cervix and vagina no lesions. No CMT. Uterus small and nontender. No adnexal masses or tenderness.  Assessment  Recent positive chlamydia, treated. History of LGSIL.  Plan  Repeat GC/CT from urine. Annual examination in may 2015 for repeat pap. Continue condom use.   15 minutes face to face time of which over 50% was spent in counseling.   After visit summary to the patient.

## 2013-07-30 ENCOUNTER — Ambulatory Visit: Payer: PRIVATE HEALTH INSURANCE | Admitting: Obstetrics and Gynecology

## 2013-07-30 ENCOUNTER — Encounter: Payer: Self-pay | Admitting: Obstetrics and Gynecology

## 2013-07-30 ENCOUNTER — Other Ambulatory Visit: Payer: Self-pay | Admitting: Obstetrics and Gynecology

## 2013-07-30 ENCOUNTER — Ambulatory Visit (INDEPENDENT_AMBULATORY_CARE_PROVIDER_SITE_OTHER): Payer: PRIVATE HEALTH INSURANCE | Admitting: Obstetrics and Gynecology

## 2013-07-30 VITALS — BP 118/70 | HR 74 | Temp 98.6°F | Ht 62.5 in | Wt 154.2 lb

## 2013-07-30 DIAGNOSIS — Z113 Encounter for screening for infections with a predominantly sexual mode of transmission: Secondary | ICD-10-CM

## 2013-07-30 DIAGNOSIS — Z01419 Encounter for gynecological examination (general) (routine) without abnormal findings: Secondary | ICD-10-CM

## 2013-07-30 DIAGNOSIS — Z0000 Encounter for general adult medical examination without abnormal findings: Secondary | ICD-10-CM

## 2013-07-30 LAB — POCT URINALYSIS DIPSTICK
BILIRUBIN UA: NEGATIVE
Blood, UA: 50
GLUCOSE UA: NEGATIVE
KETONES UA: NEGATIVE
Nitrite, UA: NEGATIVE
Protein, UA: NEGATIVE
UROBILINOGEN UA: NEGATIVE
pH, UA: 7

## 2013-07-30 NOTE — Patient Instructions (Signed)

## 2013-07-30 NOTE — Progress Notes (Signed)
Patient ID: Krista [name], female   DOB: [birth year], 28 y.o.   MRN: [id number] GYNECOLOGY VISIT  PCP:   None  Referring provider:   HPI: 28 y.o.   Single  Hispanic  female   G0P0 with Patient's last menstrual period was 07/15/2013.   here for   AEX. Needs pap and HR HPV testing.  Had LGSIL pap last year.   Menses are normal off OCPs. Heavier bleeding and cramping the first day of her cycle.  Takes Tylenol or Advil and this works well.   Satisfied with condom use.  Same partner for 9 months.  Considering future pregnancy.  Wants full STD testing.  Declines general blood work.   Normal pelvic ultrasound last year done for enlarged uterus on pelvic exam.   Urine:    GYNECOLOGIC HISTORY: Patient's last menstrual period was 07/15/2013. Sexually active: yes  Partner preference: female Contraception: condoms everytime   Menopausal hormone therapy: no DES exposure: no Blood transfusions: no   Sexually transmitted diseases:  Tx'd for Chlamydia 12/2012  GYN procedures and prior surgeries:  no Last mammogram:   n/a              Last pap and high risk HPV testing: LSIL on pap 07-27-12--needs repeat pap in one year   History of abnormal pap smear:  LSIL on pap 07-27-12 and needs repeat pap 07/2013.   OB History   Grav Para Term Preterm Abortions TAB SAB Ect Mult Living   0                LIFESTYLE: Exercise:   Cardio.          Tobacco:    Alcohol:      Two mix drinks per week Drug use:   no  OTHER HEALTH MAINTENANCE: Tetanus/TDap:    2013 Gardisil:                Completed Gardasil 07/2012 Influenza:              never Zostavax:              n/a  Bone density:        n/a Colonoscopy:        n/a  Cholesterol check:   unsure   Family History  Problem Relation Age of Onset  . Diabetes Maternal Grandfather     There are no active problems to display for this patient.  Past Medical History  Diagnosis Date  . Anemia   . STD (sexually transmitted disease) 12/2012     + Chlam    History reviewed. No pertinent past surgical history.  ALLERGIES: Shellfish allergy  No current outpatient prescriptions on file.   No current facility-administered medications for this visit.     ROS:  Pertinent items are noted in HPI.  SOCIAL HISTORY:  Lifespan.   Is a Pharmacist, hospital.   PHYSICAL EXAMINATION:    BP 118/70  Pulse 74  Temp(Src) 98.6 F (37 C)  Ht 5' 2.5" (1.588 m)  Wt 154 lb 3.2 oz (69.945 kg)  BMI 27.74 kg/m2  LMP 07/15/2013   Wt Readings from Last 3 Encounters:  07/30/13 154 lb 3.2 oz (69.945 kg)  04/19/13 151 lb (68.493 kg)  01/30/13 145 lb (65.772 kg)     Ht Readings from Last 3 Encounters:  07/30/13 5' 2.5" (1.588 m)  09/29/12 5\' 3"  (1.6 m)  08/30/12 5\' 3"  (1.6 m)    General appearance: alert, cooperative and appears stated age  Head: Normocephalic, without obvious abnormality, atraumatic Neck: no adenopathy, supple, symmetrical, trachea midline and thyroid not enlarged, symmetric, no tenderness/mass/nodules Lungs: clear to auscultation bilaterally Breasts: Inspection negative, No nipple retraction or dimpling, No nipple discharge or bleeding, No axillary or supraclavicular adenopathy, Normal to palpation without dominant masses Heart: regular rate and rhythm Abdomen: soft, non-tender; no masses,  no organomegaly Extremities: extremities normal, atraumatic, no cyanosis or edema Skin: Skin color, texture, turgor normal. No rashes or lesions Lymph nodes: Cervical, supraclavicular, and axillary nodes normal. No abnormal inguinal nodes palpated Neurologic: Grossly normal  Pelvic: External genitalia:  no lesions              Urethra:  normal appearing urethra with no masses, tenderness or lesions              Bartholins and Skenes: normal                 Vagina: normal appearing vagina with normal color and discharge, no lesions              Cervix: normal appearance              Pap and high risk HPV testing done: yes.             Bimanual Exam:  Uterus:  uterus is normal size, shape, consistency and nontender                                      Adnexa: normal adnexa in size, nontender and no masses                                      Rectovaginal: Confirms                                      Anus:  normal sphincter tone, no lesions  ASSESSMENT  Normal gynecologic exam. LGSIL pap last year.  History of chlamydia.   PLAN  Mammogram recommended yearly.  Pap smear and high risk HPV testing performed.  PNV. Discussed potential risk of future ectopic pregnancy and importance of early prenatal care for following of BHCG levels and then ultrasound.  STD testing today.  Counseled on self breast exam, Calcium and vitamin D intake, exercise. Return annually or prn   An After Visit Summary was printed and given to the patient.

## 2013-07-31 LAB — STD PANEL
HEP B S AG: NEGATIVE
HIV: NONREACTIVE

## 2013-07-31 LAB — HEPATITIS C ANTIBODY: HCV AB: NEGATIVE

## 2013-07-31 LAB — GC/CHLAMYDIA PROBE AMP, URINE
CHLAMYDIA, SWAB/URINE, PCR: NEGATIVE
GC Probe Amp, Urine: NEGATIVE

## 2013-08-02 ENCOUNTER — Other Ambulatory Visit: Payer: Self-pay | Admitting: Obstetrics and Gynecology

## 2013-08-02 LAB — URINE CULTURE: Colony Count: 90000

## 2013-08-02 LAB — IPS PAP TEST WITH HPV

## 2013-08-02 MED ORDER — AMPICILLIN 250 MG PO CAPS: 250.0000 mg | ORAL_CAPSULE | Freq: Four times a day (QID) | ORAL | Status: DC

## 2013-08-07 ENCOUNTER — Telehealth: Payer: Self-pay | Admitting: Emergency Medicine

## 2013-08-07 DIAGNOSIS — R87612 Low grade squamous intraepithelial lesion on cytologic smear of cervix (LGSIL): Secondary | ICD-10-CM

## 2013-08-07 NOTE — Telephone Encounter (Signed)
Message copied by Michele Mcalpine on Tue Aug 07, 2013  2:04 PM ------      Message from: [place], [place]: Thu Aug 02, 2013  5:34 PM       Olivia Mackie,             Please inform patient of pap showing LGSIL and positive HR HPV.             It is time to do a colposcopy for her.             This is the patient's second pap showing LGSIL.      Now that she is over 25, we need to proceed with colposcopy.            Please schedule with me.             Thanks,            [place]            Cc - Marisa Sprinkles. ------

## 2013-08-07 NOTE — Telephone Encounter (Signed)
Attempted to call patient. Voicemailbox ""the voice mailbox has not been set up yet and can't accept any new messages at this time. goodbye"

## 2013-08-07 NOTE — Telephone Encounter (Addendum)
Patient returned call.  Patient notified of message from Dr. Quincy Simmonds. She is advised that pap smear showed abnormal cells and Dr. Quincy Simmonds has advised will need colposcopy.   She is agreeable to scheduling colposcopy. Brief description of procedure given to patient.    Type of birth control -condoms LMP 07/15/13   Patient advised to call back on first day of menstrual cycle so that she can be scheduled for colposcopy prior to cycle day 12. She is agreeable.   While on the phone, patient states she was going to call, but that I reached her first. She states that since Sunday, she has been experiencing irritation and swelling in external vulvar area with white discharge. Patient denies pelvic pain, denies fevers, denies dysuria, denies odor, denies change in partners, denies itching. She would like to know if there is something she can use for otc treatment of external swelling and irritation. She feels that these symptoms are r/t wearing underwear that were not properly washed. Patient declines office visit for evaluation, would like to try otc treatment first.  Advised can try otc hydrocortisone ointment for external irritation. Also can try Monistat otc 3 day treatment, discussed usage and to follow instructions in package. Advised patient that with her prior hx of chlamydia that it would be highly recommended that she be evaluated. Patient verbalized understanding and will call back if symptoms not improved after otc treatment. Patient also to call back on first day of menses to schedule colposcopy.   Addendum: Patient had negative GC/Clamydia urine testing on 5/18.    Routing to Dr. Quincy Simmonds for review and any further instruction?

## 2013-08-08 NOTE — Telephone Encounter (Signed)
Thank you for your appropriate recommendations for this patient. I will close the encounter.

## 2013-08-17 ENCOUNTER — Telehealth: Payer: Self-pay | Admitting: Obstetrics and Gynecology

## 2013-08-17 NOTE — Telephone Encounter (Signed)
Dr.Silva, patient calling to schedule colposcopy. Patient LMP was on 5/29. Patient requesting to have procedure after 6/12. This will place her after day 12 of cycle. Would you like me to advise patient to wait until next cycle to schedule?

## 2013-08-17 NOTE — Telephone Encounter (Signed)
Patient calling to schedule colposcopy. She states she started her cycle last Saturday, 08/10/13. She wants to have the procedure after 08/24/13,

## 2013-08-20 NOTE — Telephone Encounter (Signed)
Spoke with patient. Advised patient that with last LMP being on 5/29 patient will be unable to wait until after 6/12 as we have to perform the colposcopy before cycle day 12. Patient states that this is her last week at work before summer break and would like to wait. Advised patient to call back with first day of next cycle and we can get her scheduled. Patient agreeable and verbalizes understanding.  Routing to provider for final review. Patient agreeable to disposition. Will close encounter.

## 2013-08-20 NOTE — Telephone Encounter (Signed)
Needs to wait until next cycle.

## 2013-08-24 ENCOUNTER — Telehealth: Payer: Self-pay | Admitting: Obstetrics and Gynecology

## 2013-08-24 NOTE — Telephone Encounter (Signed)
Call to patient to go over benefits for colpo. No voicemail available.

## 2013-09-17 NOTE — Telephone Encounter (Signed)
Spoke with patient. Advised that per benefit quote received, she will be responsible for $311.04.

## 2013-09-17 NOTE — Telephone Encounter (Signed)
Olivia Mackie,   Thank you for scheduling the colposcopy.  Dr. Sabra Heck,   Thank you for helping while I am out of the office.

## 2013-09-17 NOTE — Telephone Encounter (Signed)
Message copied by Michele Mcalpine on Mon Sep 17, 2013  3:08 PM ------      Message from: [place], [place]: Thu Aug 02, 2013  5:34 PM       Olivia Mackie,             Please inform patient of pap showing LGSIL and positive HR HPV.             It is time to do a colposcopy for her.             This is the patient's second pap showing LGSIL.      Now that she is over 25, we need to proceed with colposcopy.            Please schedule with me.             Thanks,            [place]            Cc - Marisa Sprinkles. ------

## 2013-09-17 NOTE — Telephone Encounter (Signed)
Patient returned call and Spoke with Felipa Emory to schedule colposcopy.  Initial results from 08/02/13.  Patient using condoms only for birth control.  Started cycle on 09/18/13, states she usually has cycle for 6-7 days.  Scheduled colposcopy with Dr. Sabra Heck for 09/24/13 with Dr. Sabra Heck as Dr. Quincy Simmonds will be out of the office.  Advised to keep with consistent condom use. Pre-procedure instructions given. Motrin instructions given. Motrin=Advil=Ibuprofen Can take 800 mg (Can purchase over the counter, you will need four 200 mg pills). Take with food. Make sure to eat a meal and drink fluids prior to appointment.  She is agreeable to plan and verbalizes understanding of instructions.   Routing to provider for final review. Patient agreeable to disposition. Will close encounter Dr. Sabra Heck and Dr. Quincy Simmonds, okay to keep scheduled as is?

## 2013-09-17 NOTE — Telephone Encounter (Signed)
Patient calling to schedule colpo

## 2013-09-17 NOTE — Telephone Encounter (Signed)
Yes.  I am fine with this plan.  Encounter closed.

## 2013-09-17 NOTE — Telephone Encounter (Signed)
Patient scheduled 07/13 @ 1030 with Dr  Sabra Heck

## 2013-09-24 ENCOUNTER — Ambulatory Visit (INDEPENDENT_AMBULATORY_CARE_PROVIDER_SITE_OTHER): Payer: PRIVATE HEALTH INSURANCE | Admitting: Obstetrics & Gynecology

## 2013-09-24 VITALS — BP 132/64 | HR 60 | Resp 16 | Ht 62.5 in | Wt 160.6 lb

## 2013-09-24 DIAGNOSIS — R87612 Low grade squamous intraepithelial lesion on cytologic smear of cervix (LGSIL): Secondary | ICD-10-CM

## 2013-09-24 NOTE — Patient Instructions (Signed)

## 2013-09-24 NOTE — Progress Notes (Addendum)
Subjective:     Patient ID: Krista [name], female   DOB: 11/25/1985, 28 y.o.   MRN: [id number]  HPI 28 yo G22 single Hispanic female here for colposcopy due to LGSIL pap with + HR HPV.  Pt had a LGSIL pap 1 year ago.  Due to age recommendations, follow up Pap with HR HPV testing was recommended.  As this was, again, abnormal, colposcopy was recommended.  D/W pt prior pap smears and indication for procedure.  All questions answered.  H/O chlamydia 8 months ago.  Testing in may was normal.    Review of Systems  All other systems reviewed and are negative.      Objective:   Physical Exam  Constitutional: She is oriented to person, place, and time. She appears well-developed and well-nourished.  Genitourinary: There is no rash or tenderness on the right labia. There is no rash or tenderness on the left labia.    Lymphadenopathy:       Right: No inguinal adenopathy present.       Left: No inguinal adenopathy present.  Neurological: She is alert and oriented to person, place, and time.  Skin: Skin is warm and dry.  Psychiatric: She has a normal mood and affect.   Speculum placed.  3% acetic acid applied to cervix for >45 seconds.  Cervix visualized with both 7.5X and 15X magnification.  Green filter also used.  Lugols solution was not used.  Findings:  As described in picture.  Biopsy:  2 and 6 o'clock.  ECC:  was performed.  Monsel's was needed.  Excellent hemostasis was present.  Pt tolerated procedure well and all instruments were removed.  Findings noted above on picture of cervix.      Assessment:     LGSIL with +HR HPV     Plan:     Biopsies with ECC pending.  If CIN 1 or greater, repeat Pap/HR HPV 1 year. If CIN 2, will follow with either colpo in six months or proceed with LEEP.      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  ECC negative.

## 2013-09-27 LAB — IPS OTHER TISSUE BIOPSY

## 2013-11-06 ENCOUNTER — Encounter: Payer: Self-pay | Admitting: Obstetrics and Gynecology

## 2014-01-08 ENCOUNTER — Telehealth: Payer: Self-pay | Admitting: Obstetrics and Gynecology

## 2014-01-08 NOTE — Telephone Encounter (Signed)
Dr cx appt lmtcb to rs °

## 2014-06-01 ENCOUNTER — Ambulatory Visit (INDEPENDENT_AMBULATORY_CARE_PROVIDER_SITE_OTHER): Payer: PRIVATE HEALTH INSURANCE | Admitting: Family Medicine

## 2014-06-01 VITALS — BP 120/78 | HR 71 | Temp 97.3°F | Resp 12 | Ht 63.0 in | Wt 166.0 lb

## 2014-06-01 DIAGNOSIS — L639 Alopecia areata, unspecified: Secondary | ICD-10-CM

## 2014-06-01 MED ORDER — TRIAMCINOLONE ACETONIDE 40 MG/ML IJ SUSP
40.0000 mg | Freq: Once | INTRAMUSCULAR | Status: AC
  Administered 2014-06-01: 40 mg via INTRAMUSCULAR

## 2014-06-01 NOTE — Progress Notes (Signed)
06/01/2014 at 3:01 PM  Krista [name] / DOB: 07-03-1985 / MRN: [id number]  The patient  does not have a problem list on file.  SUBJECTIVE  Chief compalaint: Bald Spot on Head   History of present illness: Krista [name] is 29 y.o. well appearing female presenting for an isolated incident of hair loss on her left parietal scalp just above her ear.  She noticed it two days ago and is very worried.  She has tried nothing for this problem.  She denies a history of eczema and autoimmune disorders, as well as a family history of same.  She denies constitutional symptoms at this time.  She thinks this hair loss may be secondary to stress, as she has been working three jobs over the past month.    She  has a past medical history of Anemia and STD (sexually transmitted disease) (12/2012).    She has a current medication list which includes the following prescription(s): epinephrine.  Krista [name] is allergic to shellfish allergy. She  reports that she has never smoked. She has never used smokeless tobacco. She reports that she drinks alcohol. She reports that she does not use illicit drugs. She  reports that she currently engages in sexual activity and has had female partners. She reports using the following method of birth control/protection: Condom. The patient  has no past surgical history on file.  Her family history includes Diabetes in her maternal grandfather.  Review of Systems  Constitutional: Negative.  Negative for fever, chills and weight loss.  HENT: Negative.   Respiratory: Negative.   Cardiovascular: Negative.   Gastrointestinal: Negative.   Genitourinary: Negative.   Skin: Negative for itching.       Positive for hair loss  Psychiatric/Behavioral: Negative for depression.    OBJECTIVE  Her  height is 5\' 3"  (1.6 m) and weight is 166 lb (75.297 kg). Her oral temperature is 97.3 F (36.3 C). Her blood pressure is 120/78 and her pulse is 71. Her respiration is 12 and oxygen  saturation is 100%.  The patient's body mass index is 29.41 kg/(m^2).  Physical Exam  Constitutional: She is oriented to person, place, and time. She appears well-developed.  HENT:  Head:    Neck: No thyromegaly present.  Cardiovascular: Normal rate.   Respiratory: Effort normal.  Musculoskeletal: Normal range of motion.  Neurological: She is alert and oriented to person, place, and time.  Skin: Skin is warm and dry. No rash noted. She is not diaphoretic. No erythema. No pallor.  Psychiatric: She has a normal mood and affect. Her behavior is normal. Judgment and thought content normal.   Procedure Note: Risk and benefits discussed and informed consent obtained. Alcohol prep.  3cc 2:1 mix of 2% lidocaine:kenalog 40 mg/cc injected into the lesion at roughly 1 cm apart. Patient tolerated procedure without complaint.   No results found for this or any previous visit (from the past 24 hour(s)).  ASSESSMENT & PLAN  Krista [name] was seen today for bald spot on head.  Diagnoses and all orders for this visit:  Alopecia areata: Patient with isolated hair loss.  Most likely secondary to stress etiology pending workup. Orders: -     TSH -     Sedimentation Rate -     triamcinolone acetonide (KENALOG-40) injection 40 mg; Inject 1 mL (40 mg total) into the muscle once. -     Ambulatory referral to Dermatology    The patient was advised to call or come back to clinic  if she does not see an improvement in symptoms, or worsens with the above plan.   Philis Fendt, MHS, PA-C [facility] 06/01/2014 3:01 PM

## 2014-06-02 LAB — TSH: TSH: 1.697 u[IU]/mL (ref 0.350–4.500)

## 2014-06-02 LAB — SEDIMENTATION RATE: Sed Rate: 11 mm/hr (ref 0–20)

## 2014-06-18 NOTE — Progress Notes (Signed)
Pt assessed and I was present during intradermal steroid injection, reviewed documentation and agree w/ assessment and plan. Delman Cheadle, MD MPH

## 2014-06-19 DIAGNOSIS — L639 Alopecia areata, unspecified: Secondary | ICD-10-CM | POA: Insufficient documentation

## 2014-07-30 ENCOUNTER — Encounter: Payer: Self-pay | Admitting: *Deleted

## 2014-07-30 DIAGNOSIS — L639 Alopecia areata, unspecified: Secondary | ICD-10-CM

## 2014-08-08 ENCOUNTER — Ambulatory Visit: Payer: PRIVATE HEALTH INSURANCE | Admitting: Obstetrics and Gynecology

## 2014-08-09 ENCOUNTER — Telehealth: Payer: Self-pay | Admitting: Obstetrics & Gynecology

## 2014-08-09 NOTE — Telephone Encounter (Signed)
Patient is asking for an appointment for "irregular cycles". Last seen 09/24/13.

## 2014-08-09 NOTE — Telephone Encounter (Signed)
Spoke with patient. She states she has had irregular cycles x 2 months since having Kenalog injections x 2 at urgent care for alopecia, last injection 06/01/14.   Patient not currently on birth control. She states her and partner are actively trying to conceive, however, irregular cycles are making timing difficult.  States LMP was 5/13 and this was regular cycle for her that lasted 5 days. Today, she is having some spotting. Wearing light tampon only. Denies pain. Felt some cramping yesterday, but it has since resolved. Patient denies heavy bleeding. Patient feels irregular cycles are due to steroid injection and wants evaluation to ensure okay to continue to try for pregnancy.   I advised patient to continue to keep calendar of menses, call back if bleeding continues or is soaking pads or if any abdominal pain or cramping.   I advised I would call back if additional instructions from provider.   She verbalizes understanding of symptoms of bleeding emergencies and when to call back to office or go to urgent care or emergency room as needed. Patient is agreeable and verbalizes understanding of plan and will call back if condition changes or worsens.  Patient is scheduled for office visit with Regina Eck CNM for 08/13/14 at 0830. Multiple dates and times offered to fit patient's request for early morning or late afternoon appointment. Agreeable to provider change as openings do not meet her schedule needs.   Routing to Regina Eck CNM and cc Dr. Quincy Simmonds as primary gyn.   Routing to provider for final review. Patient agreeable to disposition. Will close encounter.   Patient aware provider will review message and nurse will return call if any additional advice or change of disposition.

## 2014-08-13 ENCOUNTER — Telehealth: Payer: Self-pay | Admitting: Certified Nurse Midwife

## 2014-08-13 ENCOUNTER — Ambulatory Visit: Payer: PRIVATE HEALTH INSURANCE | Admitting: Certified Nurse Midwife

## 2014-08-13 NOTE — Telephone Encounter (Signed)
Called patient about dnka appointment and she says she had to go in to work. Rescheduled to 08/26/14 when she will have more time.

## 2014-08-26 ENCOUNTER — Encounter: Payer: Self-pay | Admitting: Certified Nurse Midwife

## 2014-08-26 ENCOUNTER — Ambulatory Visit (INDEPENDENT_AMBULATORY_CARE_PROVIDER_SITE_OTHER): Payer: PRIVATE HEALTH INSURANCE | Admitting: Certified Nurse Midwife

## 2014-08-26 VITALS — BP 102/70 | HR 70 | Resp 20 | Ht 62.5 in | Wt 165.0 lb

## 2014-08-26 DIAGNOSIS — N938 Other specified abnormal uterine and vaginal bleeding: Secondary | ICD-10-CM | POA: Diagnosis not present

## 2014-08-26 DIAGNOSIS — N926 Irregular menstruation, unspecified: Secondary | ICD-10-CM | POA: Diagnosis not present

## 2014-08-26 DIAGNOSIS — N852 Hypertrophy of uterus: Secondary | ICD-10-CM

## 2014-08-26 LAB — POCT URINE PREGNANCY: PREG TEST UR: NEGATIVE

## 2014-08-26 NOTE — Progress Notes (Signed)
Reviewed personally.  M. Suzanne Nethra Mehlberg, MD.  

## 2014-08-26 NOTE — Progress Notes (Signed)
29 y.o.Single hispanic  g0p0 here with complaint of DUB since starting injections of Steroid per patient for alopecia. She has had hair return, but notes bleeding twice monthly for 7 days  Duration each time. Last injection was 1-2 months ago. Patient feels this is the cause and here to make sure no problems. 2 days late for normal cycle now with negative UPT here. Contraception is none, pregnancy would be fine for both her and partner. No other health issues today.   O:Healthy female WDWN Affect: normal, orientation x 3  Exam: Abdomen:soft, non tender, no masses palpate Inguinal Lymph node: no enlargement or tenderness Pelvic exam: External genital: normal female BUS: negative Vagina: normal discharge noted. Cervix: normal, non tender, no CMT Uterus: ? Enlarged noted at  vs right adnexal mass Adnexa:normal, left non tender, no masses or fullness noted  UPT negative   A:DUB for the past 2 months with onset of use of steroid for Alopecia Normal pelvic exam except for ? Enlarged uterus vs right adnexal mass Contraception none   P:Discussed findings of normal pelvic exam except for enlarged uterus vs right adnexal mass and etiology. Discussed PUS for evaluation and patient agreeable. Discussed prenatal vitamin daily if no contraception if pregnancy occurs. Will continue menses calendar and advise if period occurs. Patient aware she will be called with insurance info and scheduled. Warning signs of excessive bleeding given.  Rv prn

## 2014-08-26 NOTE — Patient Instructions (Signed)
Dysfunctional Uterine Bleeding Normally, menstrual periods begin between ages 11 to 17 in young women. A normal menstrual cycle/period may begin every 23 days up to 35 days and lasts from 1 to 7 days. Around 12 to 14 days before your menstrual period starts, ovulation (ovary produces an egg) occurs. When counting the time between menstrual periods, count from the first day of bleeding of the previous period to the first day of bleeding of the next period. Dysfunctional (abnormal) uterine bleeding is bleeding that is different from a normal menstrual period. Your periods may come earlier or later than usual. They may be lighter, have blood clots or be heavier. You may have bleeding between periods, or you may skip one period or more. You may have bleeding after sexual intercourse, bleeding after menopause, or no menstrual period. CAUSES   Pregnancy (normal, miscarriage, tubal).  IUDs (intrauterine device, birth control).  Birth control pills.  Hormone treatment.  Menopause.  Infection of the cervix.  Blood clotting problems.  Infection of the inside lining of the uterus.  Endometriosis, inside lining of the uterus growing in the pelvis and other female organs.  Adhesions (scar tissue) inside the uterus.  Obesity or severe weight loss.  Uterine polyps inside the uterus.  Cancer of the vagina, cervix, or uterus.  Ovarian cysts or polycystic ovary syndrome.  Medical problems (diabetes, thyroid disease).  Uterine fibroids (noncancerous tumor).  Problems with your female hormones.  Endometrial hyperplasia, very thick lining and enlarged cells inside of the uterus.  Medicines that interfere with ovulation.  Radiation to the pelvis or abdomen.  Chemotherapy. DIAGNOSIS   Your doctor will discuss the history of your menstrual periods, medicines you are taking, changes in your weight, stress in your life, and any medical problems you may have.  Your doctor will do a physical  and pelvic examination.  Your doctor may want to perform certain tests to make a diagnosis, such as:  Pap test.  Blood tests.  Cultures for infection.  CT scan.  Ultrasound.  Hysteroscopy.  Laparoscopy.  MRI.  Hysterosalpingography.  D and C.  Endometrial biopsy. TREATMENT  Treatment will depend on the cause of the dysfunctional uterine bleeding (DUB). Treatment may include:  Observing your menstrual periods for a couple of months.  Prescribing medicines for medical problems, including:  Antibiotics.  Hormones.  Birth control pills.  Removing an IUD (intrauterine device, birth control).  Surgery:  D and C (scrape and remove tissue from inside the uterus).  Laparoscopy (examine inside the abdomen with a lighted tube).  Uterine ablation (destroy lining of the uterus with electrical current, laser, heat, or freezing).  Hysteroscopy (examine cervix and uterus with a lighted tube).  Hysterectomy (remove the uterus). HOME CARE INSTRUCTIONS   If medicines were prescribed, take exactly as directed. Do not change or switch medicines without consulting your caregiver.  Long term heavy bleeding may result in iron deficiency. Your caregiver may have prescribed iron pills. They help replace the iron that your body lost from heavy bleeding. Take exactly as directed.  Do not take aspirin or medicines that contain aspirin one week before or during your menstrual period. Aspirin may make the bleeding worse.  If you need to change your sanitary pad or tampon more than once every 2 hours, stay in bed with your feet elevated and a cold pack on your lower abdomen. Rest as much as possible, until the bleeding stops or slows down.  Eat well-balanced meals. Eat foods high in iron. Examples   are:  Leafy green vegetables.  Whole-grain breads and cereals.  Eggs.  Meat.  Liver.  Do not try to lose weight until the abnormal bleeding has stopped and your blood iron level is  back to normal. Do not lift more than ten pounds or do strenuous activities when you are bleeding.  For a couple of months, make note on your calendar, marking the start and ending of your period, and the type of bleeding (light, medium, heavy, spotting, clots or missed periods). This is for your caregiver to better evaluate your problem. SEEK MEDICAL CARE IF:   You develop nausea (feeling sick to your stomach) and vomiting, dizziness, or diarrhea while you are taking your medicine.  You are getting lightheaded or weak.  You have any problems that may be related to the medicine you are taking.  You develop pain with your DUB.  You want to remove your IUD.  You want to stop or change your birth control pills or hormones.  You have any type of abnormal bleeding mentioned above.  You are over 16 years old and have not had a menstrual period yet.  You are 29 years old and you are still having menstrual periods.  You have any of the symptoms mentioned above.  You develop a rash. SEEK IMMEDIATE MEDICAL CARE IF:   An oral temperature above 102 F (38.9 C) develops.  You develop chills.  You are changing your sanitary pad or tampon more than once an hour.  You develop abdominal pain.  You pass out or faint. Document Released: 02/27/2000 Document Revised: 05/24/2011 Document Reviewed: 01/28/2009 ExitCare Patient Information 2015 [employer]. This information is not intended to replace advice given to you by your health care provider. Make sure you discuss any questions you have with your health care provider.  

## 2014-09-02 ENCOUNTER — Telehealth: Payer: Self-pay | Admitting: Certified Nurse Midwife

## 2014-09-02 DIAGNOSIS — N938 Other specified abnormal uterine and vaginal bleeding: Secondary | ICD-10-CM

## 2014-09-02 DIAGNOSIS — N852 Hypertrophy of uterus: Secondary | ICD-10-CM

## 2014-09-02 NOTE — Telephone Encounter (Signed)
I agree with the plan for the ultrasound and the precautions given to the patient.  Thank you.

## 2014-09-02 NOTE — Telephone Encounter (Signed)
Called patient. She states she started her cycle yesterday 09/01/14.  She states that her cycle is "usually heavy but really only on the first day." Patient states she is wearing a regular pad and changing pad q 2 hours. Patient denies any abdominal pain. Denies any dizziness, weakness or lightheadedness. Patient not currently using contraception, states not actively trying for pregnancy but not preventing pregnancy.    Patient seen in office on 08/26/14 with Regina Eck CNM and had negative urine pregnancy testing in office. Enlarged uterus and ordered Pelvic ultrasound.   Advised patient to start Motrin 600 mg q 8 hours for 24 hours to help slow bleeding. Advised patient to call back or seek immediate medical care if bleeding worsens or soaking through 1 pad/tampon per hour for two hours or if becomes symptomatic with sob, chest pain, fatigued, lightheaded, or weakness, or if develops any abdominal pain.  Patient verbalized understanding.   Patient scheduled for Pelvic ultrasound  With Dr. Quincy Simmonds for 09/12/14 at 0800 and with consult at 0830. Patient accepts this appointment.   Advised would review with Dr. Quincy Simmonds and would return call if any additional instructions, patient agreeable.   Routing to Dr. Quincy Simmonds and cc Kerry Hough.

## 2014-09-02 NOTE — Telephone Encounter (Signed)
Received patient auth for ultrasound. Contacted patient and reviewed benefit. Pt agreeable and ready to schedule. Pt states she was going to call us today and notify she started her cycle Saturday and it is heavy. Pt states thursdays are not good for her for scheduling. Recommended she speak with a triage nurse to schedule. Pt agreeable to return call.   Please notify referrals when pt scheduled for insurance reasons.

## 2014-09-12 ENCOUNTER — Encounter: Payer: Self-pay | Admitting: Obstetrics and Gynecology

## 2014-09-12 ENCOUNTER — Ambulatory Visit (INDEPENDENT_AMBULATORY_CARE_PROVIDER_SITE_OTHER): Payer: PRIVATE HEALTH INSURANCE | Admitting: Obstetrics and Gynecology

## 2014-09-12 ENCOUNTER — Ambulatory Visit (INDEPENDENT_AMBULATORY_CARE_PROVIDER_SITE_OTHER): Payer: PRIVATE HEALTH INSURANCE

## 2014-09-12 VITALS — BP 112/70 | HR 64 | Ht 62.5 in | Wt 166.0 lb

## 2014-09-12 DIAGNOSIS — N926 Irregular menstruation, unspecified: Secondary | ICD-10-CM | POA: Diagnosis not present

## 2014-09-12 DIAGNOSIS — N938 Other specified abnormal uterine and vaginal bleeding: Secondary | ICD-10-CM | POA: Diagnosis not present

## 2014-09-12 DIAGNOSIS — Z319 Encounter for procreative management, unspecified: Secondary | ICD-10-CM

## 2014-09-12 DIAGNOSIS — N852 Hypertrophy of uterus: Secondary | ICD-10-CM

## 2014-09-12 MED ORDER — MEDROXYPROGESTERONE ACETATE 10 MG PO TABS: 10.0000 mg | ORAL_TABLET | Freq: Every day | ORAL | Status: DC

## 2014-09-12 NOTE — Progress Notes (Signed)
Subjective  29 y.o. G0P0 Married Hispanic female here for pelvic ultrasound for enlarged uterus versus right adnexal mass when seen for GYN problem visit on 08/26/14.  Had steroid injection in March and April for alopecia.  States it did not really help.  Having 2 menses per month since then.  May 13 - 18.  May 27 - June 1. June 18 - 23. Cycles were normal prior to steroid injections.  Previously had 28 day cycles.   Last menses, heavy flow with super tampon change every 2 hours.  Cramps with first day of cycle.  Some dysparuenia with deep penetration.  Last intercourse was yesterday am.  No condom use. Would like to try for pregnancy.  Has been using withdrawal method.  On PNV.   TSH was normal in March 2016.  Objective  Pelvic ultrasound images and report reviewed with patient.  Uterus - No masses.  EMS - 8.62. Ovaries - Bilateral follicles  Free fluid - no      Assessment   Irregular menses. I believe this is due to steroid injections for alopecia. Normal TSH.  Desire for pregnancy.  Dyspareunia with deep penetration.   Plan  If menses returns in less than 3 weeks, will do UPT and the course of Provera if UPT negative.  Side effects discussed.  Monitor cycles and dyspareunia.  If either persistent, return for re-evaluation.  Prenatal counseling given.  Avoid unnecessary exposures including steroid injections.   ___15____ minutes face to face time of which over 50% was spent in counseling.   After visit summary to patient.

## 2014-09-12 NOTE — Patient Instructions (Signed)
Krista [name],   If your next menstruation comes less than 3 weeks from the first day of your last period (June 18), then do a pregnancy test. If the test is negative, take the Provera 10 mg by mouth per day for 10 days.  This will induce another period and help to reset your menstrual clock.

## 2014-10-24 ENCOUNTER — Telehealth: Payer: Self-pay | Admitting: *Deleted

## 2014-10-24 NOTE — Telephone Encounter (Signed)
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  Pt due for pap and HR HPV.  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 Quincy Simmonds.  Please call pt to schedule AEX.  Thank you.

## 2014-10-24 NOTE — Telephone Encounter (Signed)
Called and scheduled patient for AEX 11/01/14 with Dr. Quincy Simmonds.

## 2014-10-25 NOTE — Telephone Encounter (Signed)
Recall extended to 10/2014.  Closing encounter.

## 2014-11-01 ENCOUNTER — Encounter: Payer: Self-pay | Admitting: Obstetrics and Gynecology

## 2014-11-01 ENCOUNTER — Ambulatory Visit (INDEPENDENT_AMBULATORY_CARE_PROVIDER_SITE_OTHER): Payer: PRIVATE HEALTH INSURANCE | Admitting: Obstetrics and Gynecology

## 2014-11-01 VITALS — BP 100/62 | HR 70 | Resp 20 | Ht 63.0 in | Wt 164.6 lb

## 2014-11-01 DIAGNOSIS — N912 Amenorrhea, unspecified: Secondary | ICD-10-CM

## 2014-11-01 DIAGNOSIS — Z01419 Encounter for gynecological examination (general) (routine) without abnormal findings: Secondary | ICD-10-CM

## 2014-11-01 LAB — POCT URINALYSIS DIPSTICK
BILIRUBIN UA: NEGATIVE
Glucose, UA: NEGATIVE
KETONES UA: NEGATIVE
Leukocytes, UA: NEGATIVE
Nitrite, UA: NEGATIVE
PH UA: 5
Protein, UA: NEGATIVE
Urobilinogen, UA: NEGATIVE

## 2014-11-01 LAB — CBC
HCT: 38.8 % (ref 36.0–46.0)
Hemoglobin: 12.8 g/dL (ref 12.0–15.0)
MCH: 28.6 pg (ref 26.0–34.0)
MCHC: 33 g/dL (ref 30.0–36.0)
MCV: 86.6 fL (ref 78.0–100.0)
MPV: 9.4 fL (ref 8.6–12.4)
PLATELETS: 419 10*3/uL — AB (ref 150–400)
RBC: 4.48 MIL/uL (ref 3.87–5.11)
RDW: 13.9 % (ref 11.5–15.5)
WBC: 7.8 10*3/uL (ref 4.0–10.5)

## 2014-11-01 LAB — HCG, QUANTITATIVE, PREGNANCY: hCG, Beta Chain, Quant, S: 5320.3 m[IU]/mL

## 2014-11-01 LAB — POCT URINE PREGNANCY: Preg Test, Ur: POSITIVE — AB

## 2014-11-01 NOTE — Progress Notes (Signed)
Patient ID: Krista [name], female   DOB: Jun 13, 1985, 29 y.o.   MRN: [id number] 29 y.o. G0P0 Single Hispanic female here for annual exam.   Home UPT positive two days ago.  Urinary frequency.  Some little cramps.  PCP:  None   Patient's last menstrual period was 09/29/2014 (exact date).          Sexually active: Yes.   female The current method of family planning is none.    Exercising: No.   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 MMG:  n/a Colonoscopy:  n/a BMD:   n/a  Result  n/a TDaP:  2013 Screening Labs:   Urine today: 1+ RBCs Urine UPT:  Positive   reports that she has never smoked. She has never used smokeless tobacco. She reports that she drinks about 0.6 oz of alcohol per week. She reports that she does not use illicit drugs.  Past Medical History  Diagnosis Date  . Anemia   . STD (sexually transmitted disease) 12/2012    + Chlam  . Alopecia     History reviewed. No pertinent past surgical history.  Current Outpatient Prescriptions  Medication Sig Dispense Refill  . Prenatal Vit-Fe Fumarate-FA (PRENATAL MULTIVITAMIN) TABS tablet Take 1 tablet by mouth daily at 12 noon.     No current facility-administered medications for this visit.    Family History  Problem Relation Age of Onset  . Diabetes Maternal Grandfather     ROS:  Pertinent items are noted in HPI.  Otherwise, a comprehensive ROS was negative.  Exam:   BP 100/62 mmHg  Pulse 70  Resp 20  Ht 5\' 3"  (1.6 m)  Wt 164 lb 9.6 oz (74.662 kg)  BMI 29.16 kg/m2  LMP 09/29/2014 (Exact Date)    General appearance: alert, cooperative and appears stated age Head: Normocephalic, without obvious abnormality, atraumatic Neck: no adenopathy, supple, symmetrical, trachea midline and thyroid normal to inspection and palpation Lungs: clear to auscultation  bilaterally Breasts: normal appearance, no masses or tenderness, Inspection negative, No nipple retraction or dimpling, No nipple discharge or bleeding, No axillary or supraclavicular adenopathy Heart: regular rate and rhythm Abdomen: soft, non-tender; bowel sounds normal; no masses,  no organomegaly Extremities: extremities normal, atraumatic, no cyanosis or edema Skin: Skin color, texture, turgor normal. No rashes or lesions Lymph nodes: Cervical, supraclavicular, and axillary nodes normal. No abnormal inguinal nodes palpated Neurologic: Grossly normal  Pelvic: External genitalia:  no lesions              Urethra:  normal appearing urethra with no masses, tenderness or lesions              Bartholins and Skenes: normal                 Vagina: normal appearing vagina with normal color and discharge, no lesions              Cervix: no lesions              Pap taken: Yes.   Bimanual Exam:  Uterus:  enlarged, 5 weeks size              Adnexa: normal adnexa and no mass, fullness, tenderness              Rectovaginal: No..  Confirms.  Anus:  normal sphincter tone, no lesions  Chaperone was present for exam.  Assessment:   Well woman visit with normal exam. Hx LGSIL.  Amenorrhea.  Positive UPT.  4+ [redacted] weeks gestation.  Hx chlamydia.   Plan: Yearly mammogram recommended after age 40.  Recommended self breast exam.  Pap and HR HPV as above. Discussed Calcium, Vitamin D, regular exercise program including cardiovascular and weight bearing exercise. Labs performed.  Yes.  .   See orders.  Will do CBC, quant BHCG today and in 3 days.  Ectopic precautions given.  Potential ultrasound in about 2 weeks.  Prenatal counseling given.  Recommended flu vaccine.  Refills given on medications.  No..  Follow up annually and prn.     After visit summary provided.

## 2014-11-01 NOTE — Patient Instructions (Signed)

## 2014-11-04 ENCOUNTER — Other Ambulatory Visit (INDEPENDENT_AMBULATORY_CARE_PROVIDER_SITE_OTHER): Payer: PRIVATE HEALTH INSURANCE

## 2014-11-04 DIAGNOSIS — N912 Amenorrhea, unspecified: Secondary | ICD-10-CM | POA: Diagnosis not present

## 2014-11-05 ENCOUNTER — Telehealth: Payer: Self-pay | Admitting: Obstetrics and Gynecology

## 2014-11-05 DIAGNOSIS — O3680X9 Pregnancy with inconclusive fetal viability, other fetus: Secondary | ICD-10-CM

## 2014-11-05 LAB — HCG, QUANTITATIVE, PREGNANCY: HCG, BETA CHAIN, QUANT, S: 16191.3 m[IU]/mL

## 2014-11-05 NOTE — Telephone Encounter (Signed)
Spoke with patient. Advised of results as seen below from [place]. Patient is agreeable and verbalizes understanding. I have spoken with Lamont Snowball, RN regarding scheduling of PUS. Will need to speak with Dr.Silva regarding scheduling. Advised patient of recommended ultrasound on 8/25 with Dr.Silva. Patient is agreeable. Advised I will speak with Dr.Silva regarding scheduling of ultrasound and will return call in the morning to schedule. Patient is agreeable and verbalizes understanding.  Cc: Dr.Jerston  Dr.Silva, there are two available ultrasound appointments Thursday 8/25. One at 1 pm and one at 1:30 pm. You do not have any open spots that corresponds with these openings. Please review and advise. I could double book a slot, schedule the consult with Dr.Jertson, or see if ultrasound can be performed at an outside location. Thank you.

## 2014-11-05 NOTE — Telephone Encounter (Signed)
Routing to [place] for review of labs while Dr.Silva is out of the office. Hcg quant drawn 8/22.

## 2014-11-05 NOTE — Telephone Encounter (Signed)
Patient calling for lab results from yesterday. She said, "I was told the results would only take one day."

## 2014-11-05 NOTE — Telephone Encounter (Signed)
Ok to double book a patient appointment slot with me.  I would say that 1:00 is better than 1:30 if possible.  The quant hCGs look so good, that I think that this will be a short consultation.

## 2014-11-05 NOTE — Telephone Encounter (Signed)
Spoke with patient. Advised of message as seen below from [place]. Patient is agreeable. Ultrasound scheduled for first available ultrasound on 11/07/2014 at 1pm with 1:30pm consult with Dr.Silva. Patient is agreeable to date and time. Order placed for precert.  Cc: Theresia Lo  Routing to provider for final review. Patient agreeable to disposition. Will close encounter.

## 2014-11-05 NOTE — Telephone Encounter (Signed)
The patient's HcG has risen well. She needs an ultrasound secondary to her h/o chlamydia. Please set this up in the next 1-2 days. Please inform.

## 2014-11-06 ENCOUNTER — Telehealth: Payer: Self-pay | Admitting: Obstetrics and Gynecology

## 2014-11-06 LAB — IPS PAP TEST WITH HPV

## 2014-11-06 NOTE — Telephone Encounter (Signed)
Spoke with patient. Reviewed benefit for procedure. Patient understood and agreeable. Verified arrival date/time for procedure. Patient agreeable. Ok to close. °

## 2014-11-06 NOTE — Telephone Encounter (Signed)
Please contact patient to create a plan. Options are:  Do the ultrasound in our office, the hospital, or with her OB office of choice.  Her quant HCG numbers look good are are rising normally.

## 2014-11-06 NOTE — Telephone Encounter (Signed)
Patient cancelled ultrasound appointment for Thursday. She says because of work. She is aware of our missed appointment policy.

## 2014-11-07 ENCOUNTER — Other Ambulatory Visit: Payer: PRIVATE HEALTH INSURANCE

## 2014-11-07 ENCOUNTER — Other Ambulatory Visit: Payer: PRIVATE HEALTH INSURANCE | Admitting: Obstetrics and Gynecology

## 2014-11-07 NOTE — Telephone Encounter (Addendum)
Attempted to reach patient at number provided [phone]. There was no answer and recording states that the voicemail box is currently full and not accepting messages at this time. Will try again later.

## 2014-11-08 NOTE — Telephone Encounter (Signed)
Attempted to reach patient at number provided [phone]. There was no answer and recording states that the voicemail box is currently full and not accepting messages at this time. Will try again later.

## 2014-11-11 NOTE — Telephone Encounter (Signed)
-----   Message from Michele Mcalpine, RN sent at 11/11/2014  3:53 PM EDT ----- Notes Recorded by Nunzio Cobbs, MD on 11/11/2014 at 1:55 PM Patient is pregnant and has pap showing LGSIL and positive HR HPV. She needs a colposcopy. This is done in pregnancy but no ECC is done.   Patient has not had her viability ultrasound through our office.   She may want to establish care with an OB office and proceed forward with her ultrasound and colposcopy under their care.  We definitely need a plan for follow up of this abnormal pap.

## 2014-11-11 NOTE — Telephone Encounter (Signed)
Call to patient to follow-up on ultrasound and review Pap results. Voicemail box remains full, unable to leave message.

## 2014-11-14 ENCOUNTER — Encounter: Payer: Self-pay | Admitting: *Deleted

## 2014-11-14 NOTE — Telephone Encounter (Signed)
Call to patient, number [phone] continues to have voice mail that states mailbox is full and cannot accept messages. Called [phone], rapid circuit signal, unable to leave any message.  Will mail letter.

## 2014-11-14 NOTE — Telephone Encounter (Signed)
Letter to your office for review. 

## 2014-11-15 NOTE — Telephone Encounter (Signed)
Letter mailed certified and regular mail. Encounter closed.

## 2014-11-15 NOTE — Telephone Encounter (Signed)
Letter signed by me.  Thank you.

## 2014-12-17 ENCOUNTER — Ambulatory Visit (INDEPENDENT_AMBULATORY_CARE_PROVIDER_SITE_OTHER): Payer: PRIVATE HEALTH INSURANCE | Admitting: Family Medicine

## 2014-12-17 VITALS — BP 118/72 | HR 84 | Temp 98.9°F | Resp 18 | Ht 63.5 in | Wt 164.0 lb

## 2014-12-17 DIAGNOSIS — Z111 Encounter for screening for respiratory tuberculosis: Secondary | ICD-10-CM

## 2014-12-17 DIAGNOSIS — S29012A Strain of muscle and tendon of back wall of thorax, initial encounter: Secondary | ICD-10-CM | POA: Diagnosis not present

## 2014-12-17 DIAGNOSIS — R42 Dizziness and giddiness: Secondary | ICD-10-CM

## 2014-12-17 DIAGNOSIS — M546 Pain in thoracic spine: Secondary | ICD-10-CM | POA: Diagnosis not present

## 2014-12-17 DIAGNOSIS — N912 Amenorrhea, unspecified: Secondary | ICD-10-CM

## 2014-12-17 DIAGNOSIS — M549 Dorsalgia, unspecified: Secondary | ICD-10-CM

## 2014-12-17 LAB — POCT URINE PREGNANCY: Preg Test, Ur: NEGATIVE

## 2014-12-17 MED ORDER — NAPROXEN 500 MG PO TABS: 500.0000 mg | ORAL_TABLET | Freq: Two times a day (BID) | ORAL | Status: DC

## 2014-12-17 MED ORDER — METHOCARBAMOL 500 MG PO TABS: ORAL_TABLET | ORAL | Status: DC
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
## 2014-12-17 NOTE — Patient Instructions (Addendum)
I sent in a prescription for methocarbamol, but told the pharmacist you are not going to get it filled. We will try and cancel it. Take naproxen 500 mg twice daily for pain and inflammation  Use ice or heat as needed for pain  Gentle stretches  Stay off work through tomorrow  If not much improved over the next week please return  Return between 48 and 72 hours from now for a recheck of the PPD skin test. That means you need to come after 7:45 PM on Thursday or before we close at 6 PM on Friday. Otherwise it is an invalid test.  The urine pregnancy test is negative at 4 minutes, but a minute later there is the faintest of line visible. Recommend repeat in a few days.

## 2014-12-17 NOTE — Progress Notes (Signed)

## 2014-12-19 ENCOUNTER — Telehealth: Payer: Self-pay | Admitting: *Deleted

## 2014-12-19 NOTE — Telephone Encounter (Signed)
Pt due for 08 pap recall.    Abnormal pap letter mailed regular and certified has been returned to office as "unclaimed".   Pt has transferred for [facility].    Pt has been removed from any current pap recall per Dr. Elza Rafter request.  Routing to provider for final review.  Closing encounter.

## 2014-12-20 ENCOUNTER — Ambulatory Visit (INDEPENDENT_AMBULATORY_CARE_PROVIDER_SITE_OTHER): Payer: PRIVATE HEALTH INSURANCE

## 2014-12-20 DIAGNOSIS — Z111 Encounter for screening for respiratory tuberculosis: Secondary | ICD-10-CM

## 2014-12-20 LAB — TB SKIN TEST: TB SKIN TEST: NEGATIVE

## 2014-12-25 ENCOUNTER — Telehealth: Payer: Self-pay | Admitting: Obstetrics and Gynecology

## 2014-12-25 ENCOUNTER — Emergency Department (HOSPITAL_COMMUNITY)
Admission: EM | Admit: 2014-12-25 | Discharge: 2014-12-25 | Disposition: A | Discharge status: Home / Self Care | Payer: PRIVATE HEALTH INSURANCE | Attending: Emergency Medicine | Admitting: Emergency Medicine

## 2014-12-25 ENCOUNTER — Encounter (HOSPITAL_COMMUNITY): Payer: Self-pay | Admitting: Family Medicine

## 2014-12-25 DIAGNOSIS — N938 Other specified abnormal uterine and vaginal bleeding: Secondary | ICD-10-CM | POA: Diagnosis present

## 2014-12-25 DIAGNOSIS — N921 Excessive and frequent menstruation with irregular cycle: Secondary | ICD-10-CM | POA: Diagnosis not present

## 2014-12-25 DIAGNOSIS — Z791 Long term (current) use of non-steroidal anti-inflammatories (NSAID): Secondary | ICD-10-CM | POA: Diagnosis not present

## 2014-12-25 DIAGNOSIS — Z862 Personal history of diseases of the blood and blood-forming organs and certain disorders involving the immune mechanism: Secondary | ICD-10-CM | POA: Insufficient documentation

## 2014-12-25 DIAGNOSIS — Z8619 Personal history of other infectious and parasitic diseases: Secondary | ICD-10-CM | POA: Insufficient documentation

## 2014-12-25 DIAGNOSIS — Z872 Personal history of diseases of the skin and subcutaneous tissue: Secondary | ICD-10-CM | POA: Diagnosis not present

## 2014-12-25 LAB — CBC
HEMATOCRIT: 35.3 % — AB (ref 36.0–46.0)
HEMOGLOBIN: 11.4 g/dL — AB (ref 12.0–15.0)
MCH: 27.7 pg (ref 26.0–34.0)
MCHC: 32.3 g/dL (ref 30.0–36.0)
MCV: 85.9 fL (ref 78.0–100.0)
Platelets: 350 10*3/uL (ref 150–400)
RBC: 4.11 MIL/uL (ref 3.87–5.11)
RDW: 13.5 % (ref 11.5–15.5)
WBC: 8.1 10*3/uL (ref 4.0–10.5)

## 2014-12-25 LAB — WET PREP, GENITAL
CLUE CELLS WET PREP: NONE SEEN
TRICH WET PREP: NONE SEEN
YEAST WET PREP: NONE SEEN

## 2014-12-25 LAB — I-STAT BETA HCG BLOOD, ED (MC, WL, AP ONLY): I-stat hCG, quantitative: 8.6 m[IU]/mL — ABNORMAL HIGH (ref <5)

## 2014-12-25 LAB — HCG, QUANTITATIVE, PREGNANCY: hCG, Beta Chain, Quant, S: 8 m[IU]/mL — ABNORMAL HIGH (ref <5)

## 2014-12-25 NOTE — ED Provider Notes (Signed)
CSN: [id number]     Arrival date & time 12/25/14  1210 History  By signing my name below, I, Eustaquio Maize, attest that this documentation has been prepared under the direction and in the presence of Debroah Baller, NP. Electronically Signed: Eustaquio Maize, ED Scribe. 12/25/2014. 1:55 PM.  Chief Complaint  Patient presents with  . Vaginal Bleeding   Patient is a 29 y.o. female presenting with vaginal bleeding. The history is provided by the patient. No language interpreter was used.  Vaginal Bleeding Quality:  Clots Severity:  Severe Onset quality:  Sudden Duration:  17 hours Timing:  Constant Progression:  Unchanged Chronicity:  New Context: at rest   Relieved by:  None tried Worsened by:  Nothing tried Ineffective treatments:  None tried Associated symptoms: no abdominal pain, no dysuria, no fever, no nausea and no vaginal discharge   Risk factors: terminated pregnancy      HPI Comments: Krista [name] is a 29 y.o. female who presents to the Emergency Department complaining of sudden onset, vaginal bleeding with blood clots that began last night around 8:30 PM (approximately 17 hours ago). She notes that her LNMP was in the End of July. Pt found out that she was pregnant at that time and had a medical abortion from taking Cytotec on 11/17/2014 (approximately 5 weeks ago). She notes that since then she has been having light bleeding or spotting but states that the bleeding was much more severe than normal. Pt used a super ultra tampon last night and states it was soaked within 1 hour. She denies abdominal pain, nausea, vomiting, dysuria, vaginal pain, vaginal discharge, or any other associated symptoms. Pt was approximately [redacted] weeks pregnant when she had the abortion. She has been to the clinic in [place], [place] for a check up with no acute findings. She states she has had intercourse once since the abortion with use of a condom. She did not have any pain with intercourse at that time.    Past Medical History  Diagnosis Date  . Anemia   . STD (sexually transmitted disease) 12/2012    + Chlam  . Alopecia    History reviewed. No pertinent past surgical history. Family History  Problem Relation Age of Onset  . Diabetes Maternal Grandfather    Social History  Substance Use Topics  . Smoking status: Never Smoker   . Smokeless tobacco: Never Used  . Alcohol Use: 0.6 oz/week    1 Standard drinks or equivalent per week     Comment: 1 alcoholic drinks per week   OB History    Gravida Para Term Preterm AB TAB SAB Ectopic Multiple Living   1              Review of Systems  Constitutional: Negative for fever and chills.  Gastrointestinal: Negative for nausea, vomiting and abdominal pain.  Genitourinary: Positive for vaginal bleeding. Negative for dysuria, vaginal discharge and vaginal pain.  All other systems reviewed and are negative.  Allergies  Shellfish allergy  Home Medications   Prior to Admission medications   Medication Sig Start Date End Date Taking? Authorizing Provider  naproxen (NAPROSYN) 500 MG tablet Take 1 tablet (500 mg total) by mouth 2 (two) times daily with a meal. For pain and inflammation 12/17/14   Posey Boyer, MD   Triage Vitals: BP 129/71 mmHg  Pulse 72  Temp(Src) 98.4 F (36.9 C) (Oral)  Resp 16  SpO2 100%  LMP 10/28/2014   Physical Exam  Constitutional: She  is oriented to person, place, and time. She appears well-developed and well-nourished. No distress.  HENT:  Head: Normocephalic and atraumatic.  Eyes: Conjunctivae and EOM are normal.  Neck: Normal range of motion. Neck supple.  Cardiovascular: Normal rate and regular rhythm.   Pulmonary/Chest: Effort normal and breath sounds normal. She has no wheezes.  Abdominal: Soft. Bowel sounds are normal. There is no tenderness.  Genitourinary:  External genitalia without lesions small blood vaginal vault. Cervix closed, no CMT, no adnexal tenderness, uterus without palpable  enlargement.   Musculoskeletal: Normal range of motion.  Neurological: She is alert and oriented to person, place, and time.  Skin: Skin is warm and dry.  Psychiatric: She has a normal mood and affect. Her behavior is normal.  Nursing note and vitals reviewed.   ED Course  Procedures (including critical care time)  DIAGNOSTIC STUDIES: Oxygen Saturation is 100% on RA, normal by my interpretation.    COORDINATION OF CARE: 1:54 PM-Discussed treatment plan which includes pelvic exam with pt at bedside and pt agreed to plan.   Labs Review Results for orders placed or performed during the hospital encounter of 12/25/14 (from the past 24 hour(s))  CBC     Status: Abnormal   Collection Time: 12/25/14 12:32 PM  Result Value Ref Range   WBC 8.1 4.0 - 10.5 K/uL   RBC 4.11 3.87 - 5.11 MIL/uL   Hemoglobin 11.4 (L) 12.0 - 15.0 g/dL   HCT 35.3 (L) 36.0 - 46.0 %   MCV 85.9 78.0 - 100.0 fL   MCH 27.7 26.0 - 34.0 pg   MCHC 32.3 30.0 - 36.0 g/dL   RDW 13.5 11.5 - 15.5 %   Platelets 350 150 - 400 K/uL  hCG, quantitative, pregnancy     Status: Abnormal   Collection Time: 12/25/14 12:32 PM  Result Value Ref Range   hCG, Beta Chain, Quant, S 8 (H) <5 mIU/mL  I-Stat beta hCG blood, ED ([facility], [facility], [facility] only)     Status: Abnormal   Collection Time: 12/25/14 12:41 PM  Result Value Ref Range   I-stat hCG, quantitative 8.6 (H) <5 mIU/mL   Comment 3           Will have patient follow up at [facility] in one week for Bhcg.   MDM  29 y.o. female with vaginal bleeding s/p medical abortion Sept. 4th. Stable for d/c without heaving bleeding, abdominal pain, sever anemia or dizziness. Bleeding most likely menses s/p EAB. Bhcg of 8 to be rechecked in one week to be sure it has dropped to <2. If patient has problems before then she will go to [facility] for further evaluation. No concern for ectopic pregnancy, no hemorrhage.   Final diagnoses:  Menorrhagia with irregular cycle   I personally performed the services  described in this documentation, which was scribed in my presence. The recorded information has been reviewed and is accurate.    [place], NP 12/25/14 1413  Davonna Belling, MD 12/28/14 [phone]

## 2014-12-25 NOTE — Telephone Encounter (Signed)
Noted message from Dr. Silva, will close encounter.   

## 2014-12-25 NOTE — Telephone Encounter (Signed)
Patient is having very heavy periods with blood clots. No chart

## 2014-12-25 NOTE — ED Notes (Signed)
Declined W/C at D/C and was escorted to lobby by RN. 

## 2014-12-25 NOTE — Telephone Encounter (Signed)
I agree with your recommendations.   Cc- Lamont Snowball.

## 2014-12-25 NOTE — ED Notes (Signed)
Pt here for heavy vaginal bleeding and clots. sts that started last night and soaking a tampon every hour. sts that she had an abortion 1 month ago and has been having light bleeding.

## 2014-12-25 NOTE — Discharge Instructions (Signed)
Your pregnancy hormone level today is 8. This is probably on the way down since your medical abortion on Sept. 4th. We will need to repeat the hormone level to be sure it is going down and not up. If it is going up it could indicate a new pregnancy.  Go to [facility] next Wednesday to have the blood work. Go there sooner for any problems.

## 2014-12-25 NOTE — Telephone Encounter (Signed)
Spoke with patient. She states that last night she started having a heavy cycle and has been changing her pad q 1 hour for two hours. She took 800 mg of Motrin last night and it helped with bleeding but heavy bleeding has returned.  Patient states she had an elective pregnancy termination on 11/17/14 and has had follow up. She was advised to expect light bleeding or spotting. Patient states she was sexually active two weeks ago and used a condom. Patient is currently at work in [place], [place].  Advised patient she should seek immediate medical care at closest Emergency Department and advised that soaking 2 tampons in two hours is an emergency and she should be seen as soon as possible. Patient denies pelvic or abdominal pain. Patient denies lightheadedness, but does feel weakness. She states she will discuss with co-workers and find a ride to emergency department. Advised patient not to drive herself, patient verbalized understanding.

## 2014-12-26 LAB — GC/CHLAMYDIA PROBE AMP (~~LOC~~) NOT AT ARMC
Chlamydia: NEGATIVE
Neisseria Gonorrhea: NEGATIVE

## 2015-02-20 ENCOUNTER — Other Ambulatory Visit: Payer: Self-pay

## 2017-03-16 ENCOUNTER — Encounter: Payer: PRIVATE HEALTH INSURANCE | Admitting: Physician Assistant

## 2017-06-14 ENCOUNTER — Ambulatory Visit: Payer: Self-pay | Admitting: Family Medicine

## 2017-06-22 ENCOUNTER — Encounter: Payer: Self-pay | Admitting: Physician Assistant

## 2017-11-16 ENCOUNTER — Ambulatory Visit: Payer: Self-pay | Admitting: Family Medicine

## 2018-03-20 ENCOUNTER — Encounter: Payer: Self-pay | Admitting: Family Medicine

## 2018-03-20 ENCOUNTER — Other Ambulatory Visit (HOSPITAL_COMMUNITY)
Admission: RE | Admit: 2018-03-20 | Discharge: 2018-03-20 | Disposition: A | Discharge status: Home / Self Care | Payer: BC Managed Care – PPO | Source: Ambulatory Visit | Attending: Family Medicine | Admitting: Family Medicine

## 2018-03-20 ENCOUNTER — Ambulatory Visit: Payer: BC Managed Care – PPO | Admitting: Family Medicine

## 2018-03-20 VITALS — BP 124/76 | HR 76 | Temp 97.8°F | Resp 16 | Ht 62.75 in | Wt 167.5 lb

## 2018-03-20 DIAGNOSIS — Z01419 Encounter for gynecological examination (general) (routine) without abnormal findings: Secondary | ICD-10-CM | POA: Insufficient documentation

## 2018-03-20 DIAGNOSIS — Z131 Encounter for screening for diabetes mellitus: Secondary | ICD-10-CM | POA: Diagnosis not present

## 2018-03-20 DIAGNOSIS — R87612 Low grade squamous intraepithelial lesion on cytologic smear of cervix (LGSIL): Secondary | ICD-10-CM

## 2018-03-20 DIAGNOSIS — Z8619 Personal history of other infectious and parasitic diseases: Secondary | ICD-10-CM | POA: Diagnosis present

## 2018-03-20 DIAGNOSIS — Z23 Encounter for immunization: Secondary | ICD-10-CM

## 2018-03-20 DIAGNOSIS — Z124 Encounter for screening for malignant neoplasm of cervix: Secondary | ICD-10-CM

## 2018-03-20 DIAGNOSIS — E663 Overweight: Secondary | ICD-10-CM

## 2018-03-20 DIAGNOSIS — Z3490 Encounter for supervision of normal pregnancy, unspecified, unspecified trimester: Secondary | ICD-10-CM

## 2018-03-20 DIAGNOSIS — Z862 Personal history of diseases of the blood and blood-forming organs and certain disorders involving the immune mechanism: Secondary | ICD-10-CM

## 2018-03-20 DIAGNOSIS — Z13220 Encounter for screening for lipoid disorders: Secondary | ICD-10-CM

## 2018-03-20 MED ORDER — PRENATAL VITAMIN AND MINERAL 28-0.8 MG PO TABS: 1.0000 | ORAL_TABLET | Freq: Every day | ORAL | 0 refills | Status: DC

## 2018-03-20 NOTE — Progress Notes (Signed)
Name: Krista [name]   MRN: [id number]    DOB: October 22, 1985   Date:03/20/2018       Progress Note  Subjective  Chief Complaint  Chief Complaint  Patient presents with  . 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  Exercise:  She goes to the gym, but not during the holidays.   She stopped using contraception about 3 years ago and not pregnant, she states they are not planning but not objecting at this time, discussed prenatal care and prenatal vitamins, we will check some labs today, consider fertility evaluation   USPSTF grade A and B recommendations    Office Visit from 03/20/2018 in [facility]  AUDIT-C Score  3     Depression:  Depression screen [facility] 2/9 03/20/2018 12/17/2014  Decreased Interest 1 0  Down, Depressed, Hopeless 0 0  PHQ - 2 Score 1 0  Altered sleeping 1 -  Tired, decreased energy 1 -  Change in appetite 1 -  Feeling bad or failure about yourself  0 -  Trouble concentrating 0 -  Moving slowly or fidgety/restless 0 -  Suicidal thoughts 0 -  PHQ-9 Score 4 -  Difficult doing work/chores Not difficult at all -   Hypertension: BP Readings from Last 3 Encounters:  03/20/18 124/76  12/25/14 124/67  12/17/14 118/72   Obesity: Wt Readings from Last 3 Encounters:  03/20/18 167 lb 8 oz (76 kg)  12/17/14 164 lb (74.4 kg)  11/01/14 164 lb 9.6 oz (74.7 kg)   BMI Readings from Last 3 Encounters:  03/20/18 29.91 kg/m  12/17/14 28.60 kg/m  11/01/14 29.16 kg/m    Hep C Screening: up to date STD testing and prevention (HIV/chl/gon/syphilis): not interested ,  but okay with GC Intimate partner violence:  Negative screen  Sexual History/Pain during Intercourse: no pain but very seldom has some bleeding about once every 3-4 months  Menstrual History/LMP/Abnormal Bleeding: cycles are regular, it lasts 5-6 days, two days heavy other days  light.  Incontinence Symptoms: none   Advanced Care Planning: A voluntary discussion about advance care planning including the explanation and discussion of advance directives.  Discussed health care proxy and Living will, and the patient was able to identify a health care proxy as sister Charlii Yost ).  Patient does not have a living will at present time.    BRCA gene screening:  She is not sure about all her family history Cervical cancer screening:  Pap abnormal in the past , lost to follow up, got tired of biopsies.   Osteoporosis Screening:  No results found for: HMDEXASCAN  Lipids:  Lab Results  Component Value Date   CHOL 121 12/14/2011   Lab Results  Component Value Date   HDL 53 12/14/2011   Lab Results  Component Value Date   LDLCALC 56 12/14/2011   Lab Results  Component Value Date   TRIG 60 12/14/2011   Lab Results  Component Value Date   CHOLHDL 2.3 12/14/2011   No results found for: LDLDIRECT  Glucose:  Glucose, Bld  Date Value Ref Range Status  12/14/2011 93 70 - 99 mg/dL Final    Skin cancer: discussed atypical lesions   Patient Active Problem List   Diagnosis Date Noted  . Alopecia areata 06/19/2014    History reviewed. No pertinent surgical history.  Family History  Problem Relation Age of Onset  . Diabetes Maternal Grandfather   . Stomach cancer Paternal Grandmother     Social History   Socioeconomic History  . Marital status: Significant Other    Spouse name: Not on file  . Number of children: 0  . Years of education: Not on file  . Highest education level: Bachelor's degree (e.g., BA, AB, BS)  Occupational History  . Occupation: Associate Professor: Psychologist, sport and exercise    Comment: school systerm   Social Needs  . Financial resource strain: Not hard at all  . Food insecurity:    Worry: Never true    Inability: Never true  . Transportation needs:    Medical: No    Non-medical: No  Tobacco Use  . Smoking status: Never  Smoker  . Smokeless tobacco: Never Used  Substance and Sexual Activity  . Alcohol use: Yes    Alcohol/week: 1.0 standard drinks    Types: 1 Standard drinks or equivalent per week    Comment: 1 alcoholic drinks per week  . Drug use: No  . Sexual activity: Yes    Partners: Male    Birth control/protection: None  Lifestyle  . Physical activity:    Days per week: 4 days    Minutes per session: 40 min  . Stress: To some extent  Relationships  . Social connections:    Talks on phone: Once a week    Gets together: Once a week    Attends religious service: Never    Active member of club or organization: No    Attends meetings of clubs or organizations: Never    Relationship status: Living with partner  . Intimate partner violence:    Fear of current or ex partner: No    Emotionally abused: No    Physically abused: No    Forced sexual activity: No  Other Topics Concern  . 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  Prenatal Vit-Fe Fumarate-FA (PRENATAL VITAMIN AND MINERAL) 28-0.8 MG TABS, Take 1 tablet by mouth daily., Disp: 30 tablet, Rfl: 0  Allergies  Allergen Reactions  . Shellfish Allergy Anaphylaxis     ROS  Constitutional: Negative for fever or weight change.  Respiratory: Negative for cough and shortness of breath.   Cardiovascular: Negative for chest pain or palpitations.  Gastrointestinal: Negative for abdominal pain, no bowel changes.  Musculoskeletal: Negative for gait problem or joint swelling.  Skin: Negative for rash.  Neurological: Negative for dizziness or headache.  No other specific complaints in a complete review of systems (except as listed in HPI above).  Objective  Vitals:   03/20/18 1529  BP: 124/76  Pulse: 76  Resp: 16  Temp: 97.8 F (36.6 C)  TempSrc: Oral  SpO2: 99%   Weight: 167 lb 8 oz (76 kg)  Height: 5' 2.75" (1.594 m)    Body mass index is 29.91 kg/m.  Physical Exam  Constitutional: Patient appears well-developed and overweight. . No distress.  HENT: Head: Normocephalic and atraumatic. Ears: B TMs ok, no erythema or effusion; Nose: Nose normal. Mouth/Throat: Oropharynx is clear and moist. No oropharyngeal exudate.  Eyes: Conjunctivae and EOM are normal. Pupils are equal, round, and reactive to light. No scleral icterus.  Neck: Normal range of motion.  Neck supple. No JVD present. No thyromegaly present.  Cardiovascular: Normal rate, regular rhythm and normal heart sounds.  No murmur heard. No BLE edema. Pulmonary/Chest: Effort normal and breath sounds normal. No respiratory distress. Abdominal: Soft. Bowel sounds are normal, no distension. There is no tenderness. no masses Breast:bilateral lumpy breast , no nipple discharge or rashes FEMALE GENITALIA:  External genitalia normal External urethra normal Vaginal vault normal without discharge or lesions Cervix normal without discharge or lesions Bimanual exam normal without masses RECTAL: not done  Musculoskeletal: Normal range of motion, no joint effusions. No gross deformities Neurological: he is alert and oriented to person, place, and time. No cranial nerve deficit. Coordination, balance, strength, speech and gait are normal.  Skin: Skin is warm and dry. No rash noted. No erythema.  Psychiatric: Patient has a normal mood and affect. behavior is normal. Judgment and thought content normal.  PHQ2/9: Depression screen [facility] 2/9 03/20/2018 12/17/2014  Decreased Interest 1 0  Down, Depressed, Hopeless 0 0  PHQ - 2 Score 1 0  Altered sleeping 1 -  Tired, decreased energy 1 -  Change in appetite 1 -  Feeling bad or failure about yourself  0 -  Trouble concentrating 0 -  Moving slowly or fidgety/restless 0 -  Suicidal thoughts 0 -  PHQ-9 Score 4 -  Difficult doing work/chores Not difficult at  all -     Fall Risk: Fall Risk  03/20/2018  Falls in the past year? 0  Number falls in past yr: 0  Injury with Fall? 0     Functional Status Survey: Is the patient deaf or have difficulty hearing?: No Does the patient have difficulty seeing, even when wearing glasses/contacts?: No Does the patient have difficulty concentrating, remembering, or making decisions?: No Does the patient have difficulty walking or climbing stairs?: No Does the patient have difficulty dressing or bathing?: No Does the patient have difficulty doing errands alone such as visiting a doctor's office or shopping?: No   Assessment & Plan  1. Well woman exam  - Cytology - PAP - Lipid panel - CBC with Differential/Platelet - COMPLETE METABOLIC PANEL WITH GFR - Hemoglobin A1c - Iron, TIBC and Ferritin Panel  2. Need for Tdap vaccination  - Tdap vaccine greater than or equal to 7yo IM  3. Needs flu shot  refused  4. Prenatal care, antepartum  - Prenatal Vit-Fe Fumarate-FA (PRENATAL VITAMIN AND MINERAL) 28-0.8 MG TABS; Take 1 tablet by mouth daily.  Dispense: 30 tablet; Refill: 0  5. Cervical cancer screening  - Cytology - PAP  6. Diabetes mellitus screening  - Hemoglobin A1c  7. Lipid screening  - Lipid panel  8. History of HPV infection  - Cytology - PAP  9. Low grade squamous intraepith lesion on cytologic smear cervix (lgsil)  - Cytology - PAP  10. History of anemia  - CBC with Differential/Platelet - Iron, TIBC and Ferritin Panel  11. Overweight (BMI 25.0-29.9)  - COMPLETE METABOLIC PANEL WITH GFR   -USPSTF grade A and B recommendations reviewed with patient; age-appropriate recommendations, preventive care, screening tests, etc discussed and encouraged; healthy living encouraged; see AVS for patient education given to patient -Discussed importance of 150 minutes of physical activity weekly, eat two servings of fish weekly, eat one serving of tree nuts ( cashews,  pistachios, pecans, almonds.Marland Kitchen) every other day, eat 6 servings of fruit/vegetables daily and drink plenty of water and avoid sweet beverages.

## 2018-03-20 NOTE — Patient Instructions (Signed)
Preventive Care 18-39 Years, Female Preventive care refers to lifestyle choices and visits with your health care provider that can promote health and wellness. What does preventive care include?   A yearly physical exam. This is also called an annual well check.  Dental exams once or twice a year.  Routine eye exams. Ask your health care provider how often you should have your eyes checked.  Personal lifestyle choices, including: ? Daily care of your teeth and gums. ? Regular physical activity. ? Eating a healthy diet. ? Avoiding tobacco and drug use. ? Limiting alcohol use. ? Practicing safe sex. ? Taking vitamin and mineral supplements as recommended by your health care provider. What happens during an annual well check? The services and screenings done by your health care provider during your annual well check will depend on your age, overall health, lifestyle risk factors, and family history of disease. Counseling Your health care provider may ask you questions about your:  Alcohol use.  Tobacco use.  Drug use.  Emotional well-being.  Home and relationship well-being.  Sexual activity.  Eating habits.  Work and work environment.  Method of birth control.  Menstrual cycle.  Pregnancy history. Screening You may have the following tests or measurements:  Height, weight, and BMI.  Diabetes screening. This is done by checking your blood sugar (glucose) after you have not eaten for a while (fasting).  Blood pressure.  Lipid and cholesterol levels. These may be checked every 5 years starting at age 20.  Skin check.  Hepatitis C blood test.  Hepatitis B blood test.  Sexually transmitted disease (STD) testing.  BRCA-related cancer screening. This may be done if you have a family history of breast, ovarian, tubal, or peritoneal cancers.  Pelvic exam and Pap test. This may be done every 3 years starting at age 21. Starting at age 30, this may be done every 5  years if you have a Pap test in combination with an HPV test. Discuss your test results, treatment options, and if necessary, the need for more tests with your health care provider. Vaccines Your health care provider may recommend certain vaccines, such as:  Influenza vaccine. This is recommended every year.  Tetanus, diphtheria, and acellular pertussis (Tdap, Td) vaccine. You may need a Td booster every 10 years.  Varicella vaccine. You may need this if you have not been vaccinated.  HPV vaccine. If you are 26 or younger, you may need three doses over 6 months.  Measles, mumps, and rubella (MMR) vaccine. You may need at least one dose of MMR. You may also need a second dose.  Pneumococcal 13-valent conjugate (PCV13) vaccine. You may need this if you have certain conditions and were not previously vaccinated.  Pneumococcal polysaccharide (PPSV23) vaccine. You may need one or two doses if you smoke cigarettes or if you have certain conditions.  Meningococcal vaccine. One dose is recommended if you are age 19-21 years and a first-year college student living in a residence hall, or if you have one of several medical conditions. You may also need additional booster doses.  Hepatitis A vaccine. You may need this if you have certain conditions or if you travel or work in places where you may be exposed to hepatitis A.  Hepatitis B vaccine. You may need this if you have certain conditions or if you travel or work in places where you may be exposed to hepatitis B.  Haemophilus influenzae type b (Hib) vaccine. You may need this if you   have certain risk factors. Talk to your health care provider about which screenings and vaccines you need and how often you need them. This information is not intended to replace advice given to you by your health care provider. Make sure you discuss any questions you have with your health care provider. Document Released: 04/27/2001 Document Revised: 10/12/2016  Document Reviewed: 12/31/2014 Elsevier Interactive Patient Education  2019 [employer].

## 2018-03-21 LAB — IRON,TIBC AND FERRITIN PANEL
%SAT: 16 % (calc) (ref 16–45)
Ferritin: 15 ng/mL — ABNORMAL LOW (ref 16–154)
Iron: 56 ug/dL (ref 40–190)
TIBC: 356 mcg/dL (calc) (ref 250–450)

## 2018-03-21 LAB — COMPLETE METABOLIC PANEL WITH GFR
AG Ratio: 1.5 (calc) (ref 1.0–2.5)
ALT: 9 U/L (ref 6–29)
AST: 12 U/L (ref 10–30)
Albumin: 4.3 g/dL (ref 3.6–5.1)
Alkaline phosphatase (APISO): 87 U/L (ref 33–115)
BUN: 12 mg/dL (ref 7–25)
CO2: 28 mmol/L (ref 20–32)
Calcium: 9.2 mg/dL (ref 8.6–10.2)
Chloride: 101 mmol/L (ref 98–110)
Creat: 0.79 mg/dL (ref 0.50–1.10)
GFR, Est African American: 116 mL/min/{1.73_m2} (ref >=60)
GFR, Est Non African American: 100 mL/min/{1.73_m2} (ref >=60)
Globulin: 2.8 g/dL (calc) (ref 1.9–3.7)
Glucose, Bld: 92 mg/dL (ref 65–99)
Potassium: 3.9 mmol/L (ref 3.5–5.3)
Sodium: 137 mmol/L (ref 135–146)
Total Bilirubin: 0.3 mg/dL (ref 0.2–1.2)
Total Protein: 7.1 g/dL (ref 6.1–8.1)

## 2018-03-21 LAB — CBC WITH DIFFERENTIAL/PLATELET
Absolute Monocytes: 715 cells/uL (ref 200–950)
Basophils Absolute: 77 cells/uL (ref 0–200)
Basophils Relative: 0.7 %
Eosinophils Absolute: 242 cells/uL (ref 15–500)
Eosinophils Relative: 2.2 %
HCT: 37.2 % (ref 35.0–45.0)
Hemoglobin: 12.6 g/dL (ref 11.7–15.5)
Lymphs Abs: 4037 cells/uL — ABNORMAL HIGH (ref 850–3900)
MCH: 29.1 pg (ref 27.0–33.0)
MCHC: 33.9 g/dL (ref 32.0–36.0)
MCV: 85.9 fL (ref 80.0–100.0)
MPV: 9.8 fL (ref 7.5–12.5)
Monocytes Relative: 6.5 %
Neutro Abs: 5929 cells/uL (ref 1500–7800)
Neutrophils Relative %: 53.9 %
Platelets: 471 10*3/uL — ABNORMAL HIGH (ref 140–400)
RBC: 4.33 10*6/uL (ref 3.80–5.10)
RDW: 13.2 % (ref 11.0–15.0)
Total Lymphocyte: 36.7 %
WBC: 11 10*3/uL — ABNORMAL HIGH (ref 3.8–10.8)

## 2018-03-21 LAB — HEMOGLOBIN A1C
HEMOGLOBIN A1C: 5.5 %{Hb} (ref <5.7)
MEAN PLASMA GLUCOSE: 111 (calc)
eAG (mmol/L): 6.2 (calc)

## 2018-03-21 LAB — LIPID PANEL
Cholesterol: 151 mg/dL (ref <200)
HDL: 44 mg/dL — ABNORMAL LOW (ref >50)
LDL CHOLESTEROL (CALC): 80 mg/dL
Non-HDL Cholesterol (Calc): 107 mg/dL (calc) (ref <130)
Total CHOL/HDL Ratio: 3.4 (calc) (ref <5.0)
Triglycerides: 168 mg/dL — ABNORMAL HIGH (ref <150)

## 2018-03-22 LAB — CYTOLOGY - PAP
Chlamydia: NEGATIVE
Diagnosis: NEGATIVE
HPV: NOT DETECTED
NEISSERIA GONORRHEA: NEGATIVE

## 2018-09-18 ENCOUNTER — Ambulatory Visit (INDEPENDENT_AMBULATORY_CARE_PROVIDER_SITE_OTHER): Payer: BC Managed Care – PPO | Admitting: Family Medicine

## 2018-09-18 ENCOUNTER — Encounter: Payer: Self-pay | Admitting: Family Medicine

## 2018-09-18 DIAGNOSIS — D473 Essential (hemorrhagic) thrombocythemia: Secondary | ICD-10-CM

## 2018-09-18 DIAGNOSIS — E611 Iron deficiency: Secondary | ICD-10-CM

## 2018-09-18 DIAGNOSIS — Z30011 Encounter for initial prescription of contraceptive pills: Secondary | ICD-10-CM

## 2018-09-18 DIAGNOSIS — D72829 Elevated white blood cell count, unspecified: Secondary | ICD-10-CM

## 2018-09-18 DIAGNOSIS — D75839 Thrombocytosis, unspecified: Secondary | ICD-10-CM

## 2018-09-18 MED ORDER — NORETHIN ACE-ETH ESTRAD-FE 1-20 MG-MCG PO TABS: 1.0000 | ORAL_TABLET | Freq: Every day | ORAL | 11 refills | Status: DC

## 2018-09-18 NOTE — Progress Notes (Signed)
Name: Krista [name]   MRN: [id number]    DOB: 01-20-1986   Date:09/18/2018       Progress Note  Subjective  Chief Complaint  Chief Complaint  Patient presents with  . Obesity  . Anemia  . Contraception    she would like to start back on birth control. LMP 06.20.20    I connected with  Krista [name]  on 09/18/18 at  8:20 AM EDT by a video enabled telemedicine application and verified that I am speaking with the correct person using two identifiers.  I discussed the limitations of evaluation and management by telemedicine and the availability of in person appointments. The patient expressed understanding and agreed to proceed. Staff also discussed with the patient that there may be a patient responsible charge related to this service. Patient Location: home  Provider Location: [facility]   HPI  Contraception: she states because of COVID-19 she prefers not getting pregnant at this time, her LMP was 09/02/2018. She states cycles are regular and lasts 4-5 days, first day is heavy. She denies personal or family history of DVT's Reviewed last labs.   Iron deficiency /thrombocytosis and leucocytosis: she will return for labs, denies pica or SOB  Patient Active Problem List   Diagnosis Date Noted  . Alopecia areata 06/19/2014    History reviewed. No pertinent surgical history.  Family History  Problem Relation Age of Onset  . Diabetes Maternal Grandfather   . Stomach cancer Paternal Grandmother     Social History   Socioeconomic History  . Marital status: Significant Other    Spouse name: Not on file  . Number of children: 0  . Years of education: Not on file  . Highest education level: Bachelor's degree (e.g., BA, AB, BS)  Occupational History  . Occupation: Associate Professor: Psychologist, sport and exercise    Comment: school systerm   Social Needs  . Financial resource strain: Not hard at all  . Food insecurity    Worry: Never true    Inability: Never  true  . Transportation needs    Medical: No    Non-medical: No  Tobacco Use  . Smoking status: Never Smoker  . Smokeless tobacco: Never Used  Substance and Sexual Activity  . Alcohol use: Yes    Alcohol/week: 1.0 standard drinks    Types: 1 Standard drinks or equivalent per week    Comment: 1 alcoholic drinks per week  . Drug use: No  . Sexual activity: Yes    Partners: Male    Birth control/protection: None  Lifestyle  . Physical activity    Days per week: 4 days    Minutes per session: 40 min  . Stress: To some extent  Relationships  . Social Herbalist on phone: Once a week    Gets together: Once a week    Attends religious service: Never    Active member of club or organization: No    Attends meetings of clubs or organizations: Never    Relationship status: Living with partner  . Intimate partner violence    Fear of current or ex partner: No    Emotionally abused: No    Physically abused: No    Forced sexual activity: No  Other Topics Concern  . 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  Allergies  Allergen Reactions  . Shellfish Allergy Anaphylaxis    I personally reviewed active problem list, medication list, allergies, family history, social history with the patient/caregiver today.   ROS  Constitutional: Negative for fever or weight change.  Respiratory: Negative for cough and shortness of breath.   Cardiovascular: Negative for chest pain or palpitations.  Gastrointestinal: Negative for abdominal pain, no bowel changes.  Musculoskeletal: Negative for gait problem or joint swelling.  Skin: Negative for rash.  Neurological: Negative for dizziness or headache.  No other specific complaints in a complete review of systems (except as listed in HPI above).  Objective   Virtual encounter, vitals not obtained.  There is no height or weight on file to calculate BMI.  Physical Exam  Awake, alert and oriented   PHQ2/9: Depression screen [facility] 2/9 09/18/2018 03/20/2018 12/17/2014  Decreased Interest 0 1 0  Down, Depressed, Hopeless 0 0 0  PHQ - 2 Score 0 1 0  Altered sleeping 0 1 -  Tired, decreased energy 0 1 -  Change in appetite 0 1 -  Feeling bad or failure about yourself  0 0 -  Trouble concentrating 0 0 -  Moving slowly or fidgety/restless 0 0 -  Suicidal thoughts 0 0 -  PHQ-9 Score 0 4 -  Difficult doing work/chores - Not difficult at all -   PHQ-2/9 Result is negative.    Fall Risk: Fall Risk  09/18/2018 03/20/2018  Falls in the past year? 0 0  Number falls in past yr: 0 0  Injury with Fall? 0 0     Assessment & Plan  1. Iron deficiency  - CBC with Differential/Platelet - Iron, TIBC and Ferritin Panel  2. Thrombocytosis (HCC)  - CBC with Differential/Platelet  3. Leukocytosis, unspecified type  - CBC with Differential/Platelet - Iron, TIBC and Ferritin Panel  4. Oral contraception initiation  She has taken Junel in the past and would like to go back on the same medication, rx sent to pharmacy, no previous history of leg clots and she does not smoke, advised to use condoms for the next 3 months and start the first day of her next menstrual cycle  I discussed the assessment and treatment plan with the patient. The patient was provided an opportunity to ask questions and all were answered. The patient agreed with the plan and demonstrated an understanding of the instructions.  The patient was advised to call back or seek an in-person evaluation if the symptoms worsen or if the condition fails to improve as anticipated.  I provided 15  minutes of non-face-to-face time during this encounter.

## 2018-09-26 ENCOUNTER — Other Ambulatory Visit: Payer: Self-pay | Admitting: Family Medicine

## 2018-09-26 ENCOUNTER — Telehealth: Payer: Self-pay | Admitting: Family Medicine

## 2018-09-26 ENCOUNTER — Other Ambulatory Visit: Payer: Self-pay

## 2018-09-26 DIAGNOSIS — Z111 Encounter for screening for respiratory tuberculosis: Secondary | ICD-10-CM

## 2018-09-26 NOTE — Telephone Encounter (Signed)
Patient called.  Patient aware.  

## 2018-09-26 NOTE — Telephone Encounter (Signed)
Pt is request a TB quanferon gold blood test for a job.

## 2018-09-29 LAB — QUANTIFERON-TB GOLD PLUS
Mitogen-NIL: >10 IU/mL
NIL: 0.07 IU/mL
QuantiFERON-TB Gold Plus: NEGATIVE
TB1-NIL: 0.03 IU/mL
TB2-NIL: 0.03 IU/mL

## 2018-10-02 NOTE — Telephone Encounter (Signed)
Pt called to see if she can swing by and pick up her TB results paper and physical paperwork/ please advise when ready

## 2019-01-06 ENCOUNTER — Encounter (HOSPITAL_COMMUNITY): Payer: Self-pay | Admitting: Emergency Medicine

## 2019-01-06 ENCOUNTER — Emergency Department (HOSPITAL_COMMUNITY)
Admission: EM | Admit: 2019-01-06 | Discharge: 2019-01-07 | Disposition: A | Discharge status: Home / Self Care | Payer: PRIVATE HEALTH INSURANCE | Attending: Emergency Medicine | Admitting: Emergency Medicine

## 2019-01-06 ENCOUNTER — Other Ambulatory Visit: Payer: Self-pay

## 2019-01-06 ENCOUNTER — Emergency Department (HOSPITAL_COMMUNITY): Payer: PRIVATE HEALTH INSURANCE

## 2019-01-06 DIAGNOSIS — I10 Essential (primary) hypertension: Secondary | ICD-10-CM | POA: Insufficient documentation

## 2019-01-06 DIAGNOSIS — R0789 Other chest pain: Secondary | ICD-10-CM

## 2019-01-06 DIAGNOSIS — U071 COVID-19: Secondary | ICD-10-CM

## 2019-01-06 LAB — CBC WITH DIFFERENTIAL/PLATELET
Abs Immature Granulocytes: 0.03 10*3/uL (ref 0.00–0.07)
Basophils Absolute: 0 10*3/uL (ref 0.0–0.1)
Basophils Relative: 0 %
Eosinophils Absolute: 0.1 10*3/uL (ref 0.0–0.5)
Eosinophils Relative: 1 %
HCT: 37.1 % (ref 36.0–46.0)
Hemoglobin: 12.4 g/dL (ref 12.0–15.0)
Immature Granulocytes: 0 %
Lymphocytes Relative: 21 %
Lymphs Abs: 2.3 10*3/uL (ref 0.7–4.0)
MCH: 28.7 pg (ref 26.0–34.0)
MCHC: 33.4 g/dL (ref 30.0–36.0)
MCV: 85.9 fL (ref 80.0–100.0)
Monocytes Absolute: 0.3 10*3/uL (ref 0.1–1.0)
Monocytes Relative: 3 %
Neutro Abs: 7.9 10*3/uL — ABNORMAL HIGH (ref 1.7–7.7)
Neutrophils Relative %: 75 %
Platelets: 383 10*3/uL (ref 150–400)
RBC: 4.32 MIL/uL (ref 3.87–5.11)
RDW: 12.8 % (ref 11.5–15.5)
WBC: 10.7 10*3/uL — ABNORMAL HIGH (ref 4.0–10.5)
nRBC: 0 % (ref 0.0–0.2)

## 2019-01-06 LAB — D-DIMER, QUANTITATIVE (NOT AT ARMC): D-Dimer, Quant: 0.34 ug/mL-FEU (ref 0.00–0.50)

## 2019-01-06 NOTE — ED Triage Notes (Addendum)
Pt presents to ED from home BIB [facility]. Pt c/o chest pressure and intermittent sharp R chest pain beginning this morning. Pain is 7/10. Pt was pos for COVID on Monday and has only had congestion. Pt took 81mg  aspirin at 1400. EMS VSS.

## 2019-01-06 NOTE — ED Provider Notes (Signed)
11:40 PM  Assumed care from Dr. Reather Converse and Dr. Maudie Mercury.  Patient is a 33 year old female who recently tested positive for COVID-19 who presents emergency department with chest heaviness.  No hypoxia or significant increased work of breathing.  Labs pending including D-dimer.  If D-dimer positive, will obtain CTA of the chest.  If no PE, anticipate discharge home.  12:20 AM  Pt's labs are unremarkable other than mild leukocytosis.  Troponin, D-dimer negative.  Patient states she has feeling better.  Will discharge home with supportive care instructions.  Discussed return precautions.  Will provide with work note.  She is aware that she will need to quarantine for at least 10 days after the onset of symptoms and be fever free for 3 full days without using antipyretics before coming out of quarantine.  I do not feel she needs antibiotics at this time.   Krista [name] was evaluated in Emergency Department on 01/07/2019 for the symptoms described in the history of present illness. She was evaluated in the context of the global COVID-19 pandemic, which necessitated consideration that the patient might be at risk for infection with the SARS-CoV-2 virus that causes COVID-19. Institutional protocols and algorithms that pertain to the evaluation of patients at risk for COVID-19 are in a state of rapid change based on information released by regulatory bodies including the CDC and federal and state organizations. These policies and algorithms were followed during the patient's care in the ED.   At this time, I do not feel there is any life-threatening condition present. I have reviewed and discussed all results (EKG, imaging, lab, urine as appropriate) and exam findings with patient/family. I have reviewed nursing notes and appropriate previous records.  I feel the patient is safe to be discharged home without further emergent workup and can continue workup as an outpatient as needed. Discussed usual and customary  return precautions. Patient/family verbalize understanding and are comfortable with this plan.  Outpatient follow-up has been provided as needed. All questions have been answered.    Krista [name], Delice Bison, DO 01/07/19 0020

## 2019-01-06 NOTE — ED Provider Notes (Signed)
[facility] EMERGENCY DEPARTMENT Provider Note   CSN: [id number] Arrival date & time: 01/06/19  2029  History    Chief Complaint  Patient presents with  . Chest Pain  . COVID    HPI Krista [name] is a 33 y.o. female with no significant PMHx , who presents to the ED with chest heaviness.  Patient reports that she was Covid positive on Monday.  She reports that her Covid-like symptoms started 2 to 3 weeks ago where she had some mild symptoms and anosmia for a couple of days.  She denies any fevers recently.  She denies any history of asthma or lung disease.  She does not smoke.  She has never had a blood clot before.  She takes oral contraceptives but has not had any recent long car rides or long periods of immobility.  Patient reports that chest heaviness is across her chest and mostly midsternal.  She has been having some sharp right-sided chest pain intermittently throughout the day which was the most concerning to her.  Patient reports that this may be some anxiety as she been thinking about it a lot today and has had to do some breathing exercises to calm herself.  Allergies: Shellfish allergy Medications:  Current Outpatient Medications  Medication Instructions  . aspirin 81 mg, Oral, As needed  . diphenhydrAMINE HCl (THERAFLU MULTI SYMPTOM PO) 1 packet, Oral, Every 6 hours PRN  . norethindrone-ethinyl estradiol (JUNEL FE 1/20) 1-20 MG-MCG tablet 1 tablet, Oral, Daily    Past Medical/Surgical History Past Medical History:  Diagnosis Date  . Alopecia   . Anemia   . Irregular periods/menstrual cycles   . STD (sexually transmitted disease) 12/2012   + Chlam    Patient Active Problem List   Diagnosis Date Noted  . Alopecia areata 06/19/2014   History reviewed. No pertinent surgical history.  OB History  Gravida Para Term Preterm AB Living  1 0 0 0 1 0  SAB TAB Ectopic Multiple Live Births  0 0 0 0 0  Obstetric Comments  Induced at 4 weeks treated  with oral medication     Family History  Problem Relation Age of Onset  . Diabetes Maternal Grandfather   . Stomach cancer Paternal Grandmother    Social History   Tobacco Use  . Smoking status: Never Smoker  . Smokeless tobacco: Never Used  Substance Use Topics  . Alcohol use: Yes    Alcohol/week: 1.0 standard drinks    Types: 1 Standard drinks or equivalent per week    Comment: 1 alcoholic drinks per week  . Drug use: No   Review of Systems Review of Systems  Constitutional: Negative for chills and fever.  HENT: Negative for congestion, ear pain, sinus pressure, sinus pain, sore throat and trouble swallowing.   Eyes: Negative for pain and visual disturbance.  Respiratory: Negative for cough and shortness of breath.   Cardiovascular: Negative for chest pain and palpitations.       Chest tightness  Gastrointestinal: Negative for abdominal pain, constipation, diarrhea, nausea and vomiting.  Genitourinary: Negative for dysuria, hematuria and urgency.  Musculoskeletal: Negative for arthralgias and back pain.  Skin: Negative for color change and rash.  Neurological: Negative for dizziness, seizures, syncope and light-headedness.  All other systems reviewed and are negative.   Physical Exam Updated Vital Signs BP 123/87   Pulse 78   Temp 99.3 F (37.4 C)   Resp (!) 24   Ht 5\' 3"  (1.6 m)  Wt 74.8 kg   SpO2 97%   BMI 29.23 kg/m   Physical Exam Vitals signs and nursing note reviewed.  Constitutional:      General: She is not in acute distress.    Appearance: She is well-developed.  HENT:     Head: Normocephalic and atraumatic.  Eyes:     Extraocular Movements: Extraocular movements intact.     Conjunctiva/sclera: Conjunctivae normal.     Pupils: Pupils are equal, round, and reactive to light.  Neck:     Musculoskeletal: Normal range of motion and neck supple.  Cardiovascular:     Rate and Rhythm: Normal rate and regular rhythm.  Pulmonary:     Effort:  Pulmonary effort is normal. No tachypnea, accessory muscle usage or respiratory distress.     Breath sounds: No stridor.  Chest:     Chest wall: No mass, tenderness or crepitus.  Abdominal:     General: Bowel sounds are normal.     Palpations: Abdomen is soft.     Tenderness: There is no abdominal tenderness.  Musculoskeletal:     Right lower leg: She exhibits no tenderness. No edema.     Left lower leg: She exhibits no tenderness. No edema.  Skin:    General: Skin is warm and dry.     Capillary Refill: Capillary refill takes less than 2 seconds.  Neurological:     General: No focal deficit present.     Mental Status: She is alert and oriented to person, place, and time.     ED Treatments / Results  Labs (all labs ordered are listed, but only abnormal results are displayed) Labs Reviewed  D-DIMER, QUANTITATIVE (NOT AT [facility])  CBC WITH DIFFERENTIAL/PLATELET  BASIC METABOLIC PANEL  TROPONIN I (HIGH SENSITIVITY)    EKG EKG Interpretation  Date/Time:  Saturday January 06 2019 20:31:58 EDT Ventricular Rate:  83 PR Interval:    QRS Duration: 98 QT Interval:  378 QTC Calculation: 445 R Axis:   -23 Text Interpretation:  Sinus rhythm Borderline left axis deviation Confirmed by Elnora Morrison [phone]) on 01/06/2019 9:34:34 PM   Radiology Dg Chest Portable 1 View  Result Date: 01/06/2019 CLINICAL DATA:  Chest pain, birth control, COVID-19 positive EXAM: PORTABLE CHEST 1 VIEW COMPARISON:  None. FINDINGS: No consolidation, features of edema, pneumothorax, or effusion. Pulmonary vascularity is normally distributed. The cardiomediastinal contours are unremarkable. No acute osseous or soft tissue abnormality. IMPRESSION: No acute cardiopulmonary abnormality. Electronically Signed   By: Lovena Le M.D.   On: 01/06/2019 22:41   Procedures Procedures (including critical care time)  Medications Ordered in ED Medications - No data to display  Initial Impression / Assessment and Plan  / ED Course  I have reviewed the triage vital signs and the nursing notes. Pertinent labs & imaging results that were available during my care of the patient were reviewed by me and considered in my medical decision making (see chart for details). 33 year old female with recent positive Covid on 10/19 presenting to the ED with chest pressure and heaviness with intermittent episodes of sharp right sided chest pain.  Patient reports that her Covid symptoms started about 2 to 3 weeks ago but has not been having any fevers or Covid-like symptoms.  Given recent Covid and oral contraceptive use, patient does have increased risk of clotting.  She does not have any swelling or erythema on physical exam and denies any of the symptoms previously but will check D-dimer to rule out. EKG negative.  If  D-dimer is elevated (which can be elevated in the setting of Covid presenting a compounding factor), will obtain CTA chest.  If D-dimer is negative, will discharge patient home with close follow-up with her PCP. If negative, chest heaviness likely due to MSK and anxiety given patients age and low risk of CAD/CV event.   Signed out patient with Dr. Leonides Schanz.    Final Clinical Impressions(s) / ED Diagnoses   Final diagnoses:  Chest heaviness  Hypertension, unspecified type   ED Discharge Orders    None     Disposition: discharge home w/ f/u w/ PCP   Wilber Oliphant, M.D. FM PGY-2      Wilber Oliphant, MD 01/06/19 [id number]    Elnora Morrison, MD 01/07/19 [phone]

## 2019-01-06 NOTE — Discharge Instructions (Addendum)
You were experiencing chest heaviness with no clear cause.  We have ruled out serious reasons for chest heaviness such as heart attack, pulmonary embolism (blood clot in the lung), pneumonia.  While we cannot say exactly what is causing your chest heaviness, it is most likely due to musculoskeletal pain and anxiety.  You are very young and of low risk of cardiovascular events such as heart attack.  If you develop shortness of breath and difficulty breathing, please return to the ED.  Otherwise please follow-up with your PCP for any further follow-up.  You may alternate Tylenol 1000 mg every 6 hours as needed for pain and Ibuprofen 800 mg every 8 hours as needed for pain.  Please take Ibuprofen with food.  You will need to continue for at least 10 full days after the onset of symptoms and be fever free for 3 full days before coming out of quarantine.  Any recent close contacts will also need to quarantine for 14 days.

## 2019-01-07 ENCOUNTER — Encounter: Payer: Self-pay | Admitting: Family Medicine

## 2019-01-07 LAB — BASIC METABOLIC PANEL
Anion gap: 11 (ref 5–15)
BUN: 13 mg/dL (ref 6–20)
CO2: 20 mmol/L — ABNORMAL LOW (ref 22–32)
Calcium: 8.9 mg/dL (ref 8.9–10.3)
Chloride: 108 mmol/L (ref 98–111)
Creatinine, Ser: 0.74 mg/dL (ref 0.44–1.00)
GFR calc Af Amer: >60 mL/min (ref >60)
GFR calc non Af Amer: >60 mL/min (ref >60)
Glucose, Bld: 112 mg/dL — ABNORMAL HIGH (ref 70–99)
Potassium: 3.7 mmol/L (ref 3.5–5.1)
Sodium: 139 mmol/L (ref 135–145)

## 2019-01-07 LAB — TROPONIN I (HIGH SENSITIVITY): Troponin I (High Sensitivity): 4 ng/L (ref <18)

## 2019-01-07 NOTE — ED Notes (Signed)
Discharge instructions discussed with pt. Pt verbalized understanding. Pt stable and ambulatory. No signature pad available. 

## 2019-01-08 ENCOUNTER — Other Ambulatory Visit: Payer: Self-pay | Admitting: Family Medicine

## 2019-01-08 DIAGNOSIS — U071 COVID-19: Secondary | ICD-10-CM

## 2019-01-09 ENCOUNTER — Ambulatory Visit: Payer: Self-pay | Admitting: *Deleted

## 2019-01-09 ENCOUNTER — Encounter (INDEPENDENT_AMBULATORY_CARE_PROVIDER_SITE_OTHER): Payer: Self-pay

## 2019-01-09 NOTE — Telephone Encounter (Signed)
Pt called and stated that she tested positive for covid on 01/01/19 with [facility]; pt states that she is having mild SOB with exertion; her symptom started on 01/06/2019, and she was seen in the ED due to chest pressure; her SOB is better today; she would like to speak with Dr Ancil Boozer because she was contacted by the Health Dept, and was told that she could go back to work on 01/15/2019; she is concerned because of her SOB; instructions given to pt; will route to office for notification  . Instruct the patient to remain in self-quarantine until they meet the "Non-Test Criteria for Ending Self-Isolation". Non-Test Criteria for Ending Self-Isolation All persons with fever and respiratory symptoms should isolate themselves until ALL conditions listed below are met: - at least 10 days since symptoms onset - AND 3 consecutive days fever free without antipyretics (acetaminophen [Tylenol] or ibuprofen [Advil]) - AND improvement in respiratory symptoms . If the patient develops respiratory issues/distress, seek medical care in the Emergency Department, call 911, reports symptoms and report COVID-19 positive test. Patient Instructions . patient to continue to utilize over-the-counter medications for fever (ibuprofen and/or Tylenol) and cough (cough medicine and/or sore throat lozenges). . wear a mask around people and follow good infection prevention techniques. . Patient to should only leave home to seek medical care. .  send family for food, prescriptions or medicines; or use delivery service.  . If the patient must leave the home, they must wear a mask in public. Marland Kitchen limit contact with immediate family members or caregivers in the home, and use mask, social distancing, and handwashing to decrease risk to patients. o Please continue good preventive care measures, including frequent hand washing, avoid touching your face, cover coughs/sneezes with tissue or into elbow, stay out of crowds and keep a 6-foot distance  from others.   . patient and family to clean hard surfaces touched by patient frequently with household cleaning products; she already has an appt with her PCP on 01/10/2019; she sees Dr Ancil Boozer, [place]; she verbalized understanding of the above . Marland Kitchen  Reason for Disposition . MILD difficulty breathing (e.g., minimal/no SOB at rest, SOB with walking, pulse <100)  Answer Assessment - Initial Assessment Questions 1. COVID-19 DIAGNOSIS: "Who made your Coronavirus (COVID-19) diagnosis?" "Was it confirmed by a positive lab test?" If not diagnosed by a HCP, ask "Are there lots of cases (community spread) where you live?" (See public health department website, if unsure)     [facility] 2. ONSET: "When did the COVID-19 symptoms start?"     01/06/2019 3. WORST SYMPTOM: "What is your worst symptom?" (e.g., cough, fever, shortness of breath, muscle aches)    Shortness of breath 4. COUGH: "Do you have a cough?" If so, ask: "How bad is the cough?"       no 5. FEVER: "Do you have a fever?" If so, ask: "What is your temperature, how was it measured, and when did it start?"     no 6. RESPIRATORY STATUS: "Describe your breathing?" (e.g., shortness of breath, wheezing, unable to speak)      Shortness of breath with exertion 7. BETTER-SAME-WORSE: "Are you getting better, staying the same or getting worse compared to yesterday?"  If getting worse, ask, "In what way?"     better 8. HIGH RISK DISEASE: "Do you have any chronic medical problems?" (e.g., asthma, heart or lung disease, weak immune system, etc.)  no 9. PREGNANCY: "Is there any chance you are pregnant?" "When  was your last menstrual period?"     10. OTHER SYMPTOMS: "Do you have any other symptoms?"  (e.g., chills, fatigue, headache, loss of smell or taste, muscle pain, sore throat)     no  Protocols used: CORONAVIRUS (COVID-19) DIAGNOSED OR SUSPECTED-A-AH

## 2019-01-10 ENCOUNTER — Other Ambulatory Visit: Payer: Self-pay

## 2019-01-10 ENCOUNTER — Encounter (INDEPENDENT_AMBULATORY_CARE_PROVIDER_SITE_OTHER): Payer: Self-pay

## 2019-01-10 ENCOUNTER — Encounter: Payer: Self-pay | Admitting: Family Medicine

## 2019-01-10 ENCOUNTER — Telehealth: Payer: Self-pay

## 2019-01-10 ENCOUNTER — Ambulatory Visit (INDEPENDENT_AMBULATORY_CARE_PROVIDER_SITE_OTHER): Payer: HRSA Program | Admitting: Family Medicine

## 2019-01-10 VITALS — Temp 97.5°F | Ht 62.75 in | Wt 156.0 lb

## 2019-01-10 DIAGNOSIS — D72829 Elevated white blood cell count, unspecified: Secondary | ICD-10-CM | POA: Diagnosis not present

## 2019-01-10 DIAGNOSIS — R0602 Shortness of breath: Secondary | ICD-10-CM | POA: Diagnosis not present

## 2019-01-10 DIAGNOSIS — U071 COVID-19: Secondary | ICD-10-CM

## 2019-01-10 MED ORDER — BUDESONIDE-FORMOTEROL FUMARATE 160-4.5 MCG/ACT IN AERO: 2.0000 | INHALATION_SPRAY | Freq: Two times a day (BID) | RESPIRATORY_TRACT | 0 refills | Status: DC
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
## 2019-01-10 NOTE — Telephone Encounter (Signed)
Patient advise on diarrhea per protocol:   If diarrhea remains the same: encourage patient to drink oral fluids and bland foods.   Avoid alcohol, spicy foods, caffeine or fatty foods that could make diarrhea worse.   Continue to monitor for signs of dehydration (increased thirst decreased urine output, yellow urine, dry skin, headache or dizziness).   Advise patient to try OTC medication (Imodium, kaopectate, Pepto-Bismol) as per manufacturer's instructions.   If worsening diarrhea occurs and becomes severe (6-7 bowel movements a day): notify PCP   If diarrhea last greater than 7 days: notify PCP   IF SIGNS OF DEHYDRATION OCCUR (INCREASED THIRST, DECREASED URINE OUTPUT, YELLOW URINE, DRY SKIN, HEADACHE OR DIZZINESS) ADVISE PATIENT TO CALL [facility] IN THE ED  Patient had to episodes of diarrhea this morning. Patient verbalized understanding and agrees with plan.

## 2019-01-11 ENCOUNTER — Encounter: Payer: Self-pay | Admitting: Family Medicine

## 2019-01-11 ENCOUNTER — Encounter (INDEPENDENT_AMBULATORY_CARE_PROVIDER_SITE_OTHER): Payer: Self-pay

## 2019-01-18 ENCOUNTER — Other Ambulatory Visit: Payer: Self-pay | Admitting: Family Medicine

## 2019-01-18 DIAGNOSIS — U071 COVID-19: Secondary | ICD-10-CM

## 2019-03-08 ENCOUNTER — Emergency Department (HOSPITAL_COMMUNITY)
Admission: EM | Admit: 2019-03-08 | Discharge: 2019-03-08 | Disposition: A | Discharge status: Home / Self Care | Payer: No Typology Code available for payment source | Attending: Emergency Medicine | Admitting: Emergency Medicine

## 2019-03-08 ENCOUNTER — Other Ambulatory Visit: Payer: Self-pay

## 2019-03-08 ENCOUNTER — Encounter (HOSPITAL_COMMUNITY): Payer: Self-pay | Admitting: Emergency Medicine

## 2019-03-08 DIAGNOSIS — Y939 Activity, unspecified: Secondary | ICD-10-CM | POA: Insufficient documentation

## 2019-03-08 DIAGNOSIS — S39012A Strain of muscle, fascia and tendon of lower back, initial encounter: Secondary | ICD-10-CM | POA: Insufficient documentation

## 2019-03-08 DIAGNOSIS — Z79899 Other long term (current) drug therapy: Secondary | ICD-10-CM | POA: Insufficient documentation

## 2019-03-08 DIAGNOSIS — Y999 Unspecified external cause status: Secondary | ICD-10-CM | POA: Insufficient documentation

## 2019-03-08 DIAGNOSIS — Y929 Unspecified place or not applicable: Secondary | ICD-10-CM | POA: Insufficient documentation

## 2019-03-08 DIAGNOSIS — Z7982 Long term (current) use of aspirin: Secondary | ICD-10-CM | POA: Insufficient documentation

## 2019-03-08 MED ORDER — METHOCARBAMOL 500 MG PO TABS: 500.0000 mg | ORAL_TABLET | Freq: Two times a day (BID) | ORAL | 0 refills | Status: DC
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
## 2019-03-08 NOTE — ED Notes (Signed)
Patient verbalizes understanding of discharge instructions. Opportunity for questioning and answers were provided.  pt discharged from ED, ambulatory by self   
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
## 2019-03-08 NOTE — Discharge Instructions (Signed)
Your examination today is most concerning for a muscular injury 1. Medications: alternate ibuprofen and tylenol for pain control, take all usual home medications as they are prescribed 2. Treatment: rest, ice, elevate and use an ACE wrap or other compressive therapy to decrease swelling. Also drink plenty of fluids and do plenty of gentle stretching and move the affected muscle through its normal range of motion to prevent stiffness. 3. Follow Up: If your symptoms do not improve please follow up with orthopedics/sports medicine or your PCP for discussion of your diagnoses and further evaluation after today's visit; if you do not have a primary care doctor use the resource guide provided to find one; Please return to the ER for worsening symptoms or other concerns.   You were given a prescription for Robaxin  which is a muscle relaxer.  You should not drive, work, consume alcohol, or operate machinery while taking this medication as it can make you very drowsy.

## 2019-03-13 ENCOUNTER — Encounter: Payer: Self-pay | Admitting: Family Medicine

## 2019-03-15 ENCOUNTER — Ambulatory Visit (INDEPENDENT_AMBULATORY_CARE_PROVIDER_SITE_OTHER): Payer: Self-pay | Admitting: Family Medicine

## 2019-03-15 ENCOUNTER — Other Ambulatory Visit: Payer: Self-pay

## 2019-03-15 ENCOUNTER — Encounter: Payer: Self-pay | Admitting: Family Medicine

## 2019-03-15 VITALS — Ht 63.0 in | Wt 160.0 lb

## 2019-03-15 DIAGNOSIS — M25512 Pain in left shoulder: Secondary | ICD-10-CM

## 2019-03-15 DIAGNOSIS — S161XXA Strain of muscle, fascia and tendon at neck level, initial encounter: Secondary | ICD-10-CM

## 2019-03-15 DIAGNOSIS — S335XXA Sprain of ligaments of lumbar spine, initial encounter: Secondary | ICD-10-CM

## 2019-03-15 MED ORDER — CYCLOBENZAPRINE HCL 5 MG PO TABS: 5.0000 mg | ORAL_TABLET | Freq: Three times a day (TID) | ORAL | 1 refills | Status: DC | PRN
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
## 2019-08-30 ENCOUNTER — Telehealth: Payer: Self-pay | Admitting: Family Medicine

## 2019-08-30 ENCOUNTER — Other Ambulatory Visit: Payer: Self-pay | Admitting: Family Medicine

## 2019-08-30 DIAGNOSIS — Z30011 Encounter for initial prescription of contraceptive pills: Secondary | ICD-10-CM

## 2019-08-30 NOTE — Telephone Encounter (Signed)
lvm informing pt to schedule a physical with Dr Ancil Boozer and once this appt is scheduled Dr Ancil Boozer will give her enough medication to last until her appt.

## 2019-08-30 NOTE — Telephone Encounter (Signed)
Requested  medications are  due for refill today yes  Requested medications are on the active medication list yes  Last refill 08/02/19  Future visit scheduled no  Notes to clinic failed protocol due to no visit within 12 months

## 2019-09-04 ENCOUNTER — Encounter: Payer: Self-pay | Admitting: Family Medicine

## 2019-09-06 NOTE — Telephone Encounter (Signed)
Pt called the office and scheduled an appt for a CPE but it is not until Sept.  She still needs her [facility] refilled.  CB#  [phone]

## 2019-09-07 ENCOUNTER — Other Ambulatory Visit: Payer: Self-pay | Admitting: Family Medicine

## 2019-09-07 DIAGNOSIS — Z30011 Encounter for initial prescription of contraceptive pills: Secondary | ICD-10-CM

## 2019-09-07 NOTE — Telephone Encounter (Signed)
Appointment 9/15- RF per appointment

## 2019-09-10 ENCOUNTER — Other Ambulatory Visit: Payer: Self-pay

## 2019-09-10 DIAGNOSIS — Z30011 Encounter for initial prescription of contraceptive pills: Secondary | ICD-10-CM

## 2019-09-10 MED ORDER — NORETHIN ACE-ETH ESTRAD-FE 1-20 MG-MCG PO TABS: 1.0000 | ORAL_TABLET | Freq: Every day | ORAL | 2 refills | Status: DC

## 2019-11-28 ENCOUNTER — Encounter: Payer: Self-pay | Admitting: Family Medicine

## 2019-12-18 ENCOUNTER — Other Ambulatory Visit: Payer: Self-pay | Admitting: Family Medicine

## 2019-12-18 DIAGNOSIS — Z30011 Encounter for initial prescription of contraceptive pills: Secondary | ICD-10-CM

## 2019-12-18 NOTE — Telephone Encounter (Signed)
Requested Prescriptions  Pending Prescriptions Disp Refills  . AUROVELA FE 1/20 1-20 MG-MCG tablet [Pharmacy Med Name: AUROVELA FE 1/20 TABLETS] 84 tablet 0    Sig: TAKE 1 TABLET BY MOUTH DAILY     OB/GYN:  Contraceptives Failed - 12/18/2019  3:44 AM      Failed - Last BP in normal range    BP Readings from Last 1 Encounters:  03/08/19 (!) 146/95         Passed - Valid encounter within last 12 months    Recent Outpatient Visits          9 months ago Sprain of low back, initial encounter   [facility] Delsa Grana, [place]   11 months ago [facility] Steele Sizer, MD   1 year ago Iron deficiency   [facility] Steele Sizer, MD   1 year ago Well woman exam   [facility] Steele Sizer, [place]   5 years ago Upper back strain, initial encounter   [facility], Fenton Malling, MD      Future Appointments            In 3 months Ancil Boozer, Drue Stager, MD [facility], [facility]

## 2020-03-13 ENCOUNTER — Other Ambulatory Visit: Payer: Self-pay | Admitting: Family Medicine

## 2020-03-13 DIAGNOSIS — Z30011 Encounter for initial prescription of contraceptive pills: Secondary | ICD-10-CM

## 2020-03-18 NOTE — Patient Instructions (Signed)
Preventive Care 21-33 Years Old, Female Preventive care refers to visits with your health care provider and lifestyle choices that can promote health and wellness. This includes:  A yearly physical exam. This may also be called an annual well check.  Regular dental visits and eye exams.  Immunizations.  Screening for certain conditions.  Healthy lifestyle choices, such as eating a healthy diet, getting regular exercise, not using drugs or products that contain nicotine and tobacco, and limiting alcohol use. What can I expect for my preventive care visit? Physical exam Your health care provider will check your:  Height and weight. This may be used to calculate body mass index (BMI), which tells if you are at a healthy weight.  Heart rate and blood pressure.  Skin for abnormal spots. Counseling Your health care provider may ask you questions about your:  Alcohol, tobacco, and drug use.  Emotional well-being.  Home and relationship well-being.  Sexual activity.  Eating habits.  Work and work environment.  Method of birth control.  Menstrual cycle.  Pregnancy history. What immunizations do I need?  Influenza (flu) vaccine  This is recommended every year. Tetanus, diphtheria, and pertussis (Tdap) vaccine  You may need a Td booster every 10 years. Varicella (chickenpox) vaccine  You may need this if you have not been vaccinated. Human papillomavirus (HPV) vaccine  If recommended by your health care provider, you may need three doses over 6 months. Measles, mumps, and rubella (MMR) vaccine  You may need at least one dose of MMR. You may also need a second dose. Meningococcal conjugate (MenACWY) vaccine  One dose is recommended if you are age 19-21 years and a first-year college student living in a residence hall, or if you have one of several medical conditions. You may also need additional booster doses. Pneumococcal conjugate (PCV13) vaccine  You may need  this if you have certain conditions and were not previously vaccinated. Pneumococcal polysaccharide (PPSV23) vaccine  You may need one or two doses if you smoke cigarettes or if you have certain conditions. Hepatitis A vaccine  You may need this if you have certain conditions or if you travel or work in places where you may be exposed to hepatitis A. Hepatitis B vaccine  You may need this if you have certain conditions or if you travel or work in places where you may be exposed to hepatitis B. Haemophilus influenzae type b (Hib) vaccine  You may need this if you have certain conditions. You may receive vaccines as individual doses or as more than one vaccine together in one shot (combination vaccines). Talk with your health care provider about the risks and benefits of combination vaccines. What tests do I need?  Blood tests  Lipid and cholesterol levels. These may be checked every 5 years starting at age 20.  Hepatitis C test.  Hepatitis B test. Screening  Diabetes screening. This is done by checking your blood sugar (glucose) after you have not eaten for a while (fasting).  Sexually transmitted disease (STD) testing.  BRCA-related cancer screening. This may be done if you have a family history of breast, ovarian, tubal, or peritoneal cancers.  Pelvic exam and Pap test. This may be done every 3 years starting at age 21. Starting at age 30, this may be done every 5 years if you have a Pap test in combination with an HPV test. Talk with your health care provider about your test results, treatment options, and if necessary, the need for more tests.   Follow these instructions at home: Eating and drinking   Eat a diet that includes fresh fruits and vegetables, whole grains, lean protein, and low-fat dairy.  Take vitamin and mineral supplements as recommended by your health care provider.  Do not drink alcohol if: ? Your health care provider tells you not to drink. ? You are  pregnant, may be pregnant, or are planning to become pregnant.  If you drink alcohol: ? Limit how much you have to 0-1 drink a day. ? Be aware of how much alcohol is in your drink. In the [place] one drink equals one 12 oz bottle of beer (355 mL), one 5 oz glass of wine (148 mL), or one 1 oz glass of hard liquor (44 mL). Lifestyle  Take daily care of your teeth and gums.  Stay active. Exercise for at least 30 minutes on 5 or more days each week.  Do not use any products that contain nicotine or tobacco, such as cigarettes, e-cigarettes, and chewing tobacco. If you need help quitting, ask your health care provider.  If you are sexually active, practice safe sex. Use a condom or other form of birth control (contraception) in order to prevent pregnancy and STIs (sexually transmitted infections). If you plan to become pregnant, see your health care provider for a preconception visit. What's next?  Visit your health care provider once a year for a well check visit.  Ask your health care provider how often you should have your eyes and teeth checked.  Stay up to date on all vaccines. This information is not intended to replace advice given to you by your health care provider. Make sure you discuss any questions you have with your health care provider. Document Revised: 11/10/2017 Document Reviewed: 11/10/2017 Elsevier Patient Education  2020 [employer].

## 2020-03-18 NOTE — Progress Notes (Signed)
Name: Krista [name]   MRN: [id number]    DOB: [birth year]   Date:03/19/2020       Progress Note  Subjective  Chief Complaint  Annual Exam   HPI  Patient presents for annual CPE  Hives: she states over the past year she had two episodes of hives, once while in bed and the other time after she ate a burger, not associated with wheezing, angioedema or sob. She has shellfish allergy but not aware of any other forms of allergy and would like to see an immunologist   Dyslipidemia: low HDL, discussed ways to increase value , and how to decrease triglycerides   Low ferritin: she takes supplements but not daily , taking it about once a month, cycles are not heavy.   Diet: high in carbs, she states she will try to improve her diet , enough calcium, having fruit and some vegetables Exercise: only active at the classroom, pre-school teacher   Joice Visit from 03/19/2020 in [facility]  AUDIT-C Score 1     Depression: Phq 9 is  negative Depression screen [facility] 2/9 03/19/2020 03/15/2019 01/10/2019 09/18/2018 03/20/2018  Decreased Interest 0 0 0 0 1  Down, Depressed, Hopeless 0 0 0 0 0  PHQ - 2 Score 0 0 0 0 1  Altered sleeping - 0 0 0 1  Tired, decreased energy - 0 0 0 1  Change in appetite - 0 0 0 1  Feeling bad or failure about yourself  - 0 0 0 0  Trouble concentrating - 0 0 0 0  Moving slowly or fidgety/restless - 0 0 0 0  Suicidal thoughts - 0 0 0 0  PHQ-9 Score - 0 0 0 4  Difficult doing work/chores - Not difficult at all Not difficult at all - Not difficult at all   Hypertension: BP Readings from Last 3 Encounters:  03/19/20 126/76  03/08/19 (!) 146/95  01/07/19 (!) 121/48   Obesity: Wt Readings from Last 3 Encounters:  03/19/20 179 lb 4.8 oz (81.3 kg)  03/15/19 160 lb (72.6 kg)  01/10/19 156 lb (70.8 kg)   BMI Readings from Last 3 Encounters:  03/19/20 31.76 kg/m  03/15/19 28.34 kg/m  01/10/19 27.85 kg/m     Vaccines:   Flu: Offered  at today's visit, educated and discussed with patient. She refused   Hep C Screening: 07/30/2013 STD testing and prevention (HIV/chl/gon/syphilis): today  Intimate partner violence: negative screen  Sexual History : sex only with fiance, no condoms but taking ocp's Menstrual History/LMP/Abnormal Bleeding: cycles are regular on ocp, she has cramps for one day but stable Incontinence Symptoms: no problems   Breast cancer:  - Last Mammogram: N/A - BRCA gene screening: N/A  Osteoporosis: Discussed high calcium and vitamin D supplementation, weight bearing exercises  Cervical cancer screening: 03/20/2018  Skin cancer: Discussed monitoring for atypical lesions  Colorectal cancer: N/A  ECG: 01/08/2019  Advanced Care Planning: A voluntary discussion about advance care planning including the explanation and discussion of advance directives.  Discussed health care proxy and Living will, and the patient was able to identify a health care proxy as fiance - Daleen Snook - [phone]    Patient does not have a living will at present time.  Lipids: Lab Results  Component Value Date   CHOL 151 03/20/2018   CHOL 121 12/14/2011   Lab Results  Component Value Date   HDL 44 (L) 03/20/2018   HDL 53 12/14/2011   Lab  Results  Component Value Date   LDLCALC 80 03/20/2018   LDLCALC 56 12/14/2011   Lab Results  Component Value Date   TRIG 168 (H) 03/20/2018   TRIG 60 12/14/2011   Lab Results  Component Value Date   CHOLHDL 3.4 03/20/2018   CHOLHDL 2.3 12/14/2011   No results found for: LDLDIRECT  Glucose: Glucose, Bld  Date Value Ref Range Status  01/06/2019 112 (H) 70 - 99 mg/dL Final  03/20/2018 92 65 - 99 mg/dL Final    Comment:    .            Fasting reference interval .   12/14/2011 93 70 - 99 mg/dL Final    Patient Active Problem List   Diagnosis Date Noted  . Alopecia areata 06/19/2014    History reviewed. No pertinent surgical history.  Family History  Problem  Relation Age of Onset  . Diabetes Maternal Grandfather   . Stomach cancer Paternal Grandmother     Social History   Socioeconomic History  . Marital status: Single    Spouse name: Not on file  . Number of children: 0  . Years of education: Not on file  . Highest education level: Bachelor's degree (e.g., BA, AB, BS)  Occupational History  . Occupation: Associate Professor: Psychologist, sport and exercise    Comment: school systerm   Tobacco Use  . Smoking status: Never Smoker  . Smokeless tobacco: Never Used  Vaping Use  . Vaping Use: Never used  Substance and Sexual Activity  . Alcohol use: Yes    Alcohol/week: 1.0 standard drink    Types: 1 Standard drinks or equivalent per week    Comment: 1 alcoholic drinks per week  . Drug use: No  . Sexual activity: Yes    Partners: Male    Birth control/protection: None  Other Topics Concern  . 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 Difficulty of Paying Living Expenses: Not hard at all  Food Insecurity: No Food Insecurity  . Worried About Charity fundraiser in the Last Year: Never true  . Ran Out of Food in the Last Year: Never true  Transportation Needs: No Transportation Needs  . Lack of Transportation (Medical): No  . Lack of Transportation (Non-Medical): No  Physical Activity: Insufficiently Active  . Days of Exercise per Week: 5 days  . Minutes of Exercise per Session: 20 min  Stress: No Stress Concern Present  . Feeling of Stress : Only a little  Social Connections: Moderately Isolated  . Frequency of Communication with Friends and Family: Three times a week  . Frequency of Social Gatherings with Friends and Family: Once a week  . Attends  Religious Services: Never  . Active Member of Clubs or Organizations: No  . Attends Archivist Meetings: Never  . Marital Status: Living with partner  Intimate Partner Violence: Not At Risk  . Fear of Current or Ex-Partner: No  . Emotionally Abused: No  . Physically Abused: No  . Sexually Abused: No     Current Outpatient Medications:  .  AUROVELA FE 1/20 1-20 MG-MCG tablet, TAKE 1  TABLET BY MOUTH DAILY, Disp: 84 tablet, Rfl: 0 .  cyclobenzaprine (FLEXERIL) 5 MG tablet, Take 1 tablet (5 mg total) by mouth 3 (three) times daily as needed for muscle spasms. (Patient not taking: Reported on 03/19/2020), Disp: 30 tablet, Rfl: 1 .  methocarbamol (ROBAXIN) 500 MG tablet, Take 1 tablet (500 mg total) by mouth 2 (two) times daily. (Patient not taking: Reported on 03/19/2020), Disp: 20 tablet, Rfl: 0  Allergies  Allergen Reactions  . Shellfish Allergy Anaphylaxis     ROS  Constitutional: Negative for fever or weight change.  Respiratory: Negative for cough and shortness of breath.   Cardiovascular: Negative for chest pain or palpitations.  Gastrointestinal: Negative for abdominal pain, no bowel changes.  Musculoskeletal: Negative for gait problem or joint swelling.  Skin: Negative for rash.  Neurological: Negative for dizziness or headache.  No other specific complaints in a complete review of systems (except as listed in HPI above).  Objective  Vitals:   03/19/20 1501  BP: 126/76  Pulse: 95  Resp: 18  Temp: 98.4 F (36.9 C)  TempSrc: Oral  SpO2: 97%  Weight: 179 lb 4.8 oz (81.3 kg)  Height: '5\' 3"'  (1.6 m)    Body mass index is 31.76 kg/m.  Physical Exam  Constitutional: Patient appears well-developed and well-nourished. No distress.  HENT: Head: Normocephalic and atraumatic. Ears: B TMs ok, no erythema or effusion; Nose: not done  Eyes: Conjunctivae and EOM are normal. Pupils are equal, round, and reactive to light. No scleral icterus.  Neck: Normal range of  motion. Neck supple. No JVD present. No thyromegaly present.  Cardiovascular: Normal rate, regular rhythm and normal heart sounds.  No murmur heard. No BLE edema. Pulmonary/Chest: Effort normal and breath sounds normal. No respiratory distress. Abdominal: Soft. Bowel sounds are normal, no distension. There is no tenderness. no masses Breast: no lumps or masses, no nipple discharge or rashes FEMALE GENITALIA:  External genitalia normal External urethra normal Vaginal vault normal without discharge or lesions Cervix normal without discharge or lesions Bimanual exam normal without masses RECTAL:not done  Musculoskeletal: Normal range of motion, no joint effusions. No gross deformities Neurological: he is alert and oriented to person, place, and time. No cranial nerve deficit. Coordination, balance, strength, speech and gait are normal.  Skin: Skin is warm and dry. No rash noted. No erythema.  Psychiatric: Patient has a normal mood and affect. behavior is normal. Judgment and thought content normal.  Fall Risk: Fall Risk  03/19/2020 03/15/2019 01/10/2019 09/18/2018 03/20/2018  Falls in the past year? 0 0 0 0 0  Number falls in past yr: 0 0 0 0 0  Injury with Fall? 0 0 0 0 0  Follow up Falls evaluation completed - - - -     Functional Status Survey: Is the patient deaf or have difficulty hearing?: No Does the patient have difficulty seeing, even when wearing glasses/contacts?: No Does the patient have difficulty concentrating, remembering, or making decisions?: No Does the patient have difficulty walking or climbing stairs?: No Does the patient have difficulty dressing or bathing?: No Does the patient have difficulty doing errands alone such as visiting a doctor's office or shopping?: No   Assessment & Plan  1. Thrombocytosis  - CBC with Differential/Platelet  2. History of anemia   3. Iron deficiency  - CBC with Differential/Platelet - Iron, TIBC and Ferritin Panel  4. Cervical  cancer screening  - Cytology - PAP  5. Hives  She will continue to monitor  for now if episodes becomes more frequent or associated with sob, dizziness, nausea, vomiting, wheezing she will use epipen and we will make the referral. She will take benadryl if she has mild symptoms  6. Diabetes mellitus screening  - Hemoglobin A1c  7. History of abnormal cervical Pap smear   8. Well adult exam  - COMPLETE METABOLIC PANEL WITH GFR  9. Routine screening for STI (sexually transmitted infection)  - HIV Antibody (routine testing w rflx) - RPR  10. Dyslipidemia  - Lipid panel  11. Shellfish allergy  - EPINEPHrine (EPIPEN 2-PAK) 0.3 mg/0.3 mL IJ SOAJ injection; Inject 0.3 mg into the muscle as needed for anaphylaxis.  Dispense: 2 each; Refill: 1  -USPSTF grade A and B recommendations reviewed with patient; age-appropriate recommendations, preventive care, screening tests, etc discussed and encouraged; healthy living encouraged; see AVS for patient education given to patient -Discussed importance of 150 minutes of physical activity weekly, eat two servings of fish weekly, eat one serving of tree nuts ( cashews, pistachios, pecans, almonds.Marland Kitchen) every other day, eat 6 servings of fruit/vegetables daily and drink plenty of water and avoid sweet beverages.

## 2020-03-19 ENCOUNTER — Other Ambulatory Visit: Payer: Self-pay

## 2020-03-19 ENCOUNTER — Ambulatory Visit (INDEPENDENT_AMBULATORY_CARE_PROVIDER_SITE_OTHER): Payer: Self-pay | Admitting: Family Medicine

## 2020-03-19 ENCOUNTER — Encounter: Payer: Self-pay | Admitting: Family Medicine

## 2020-03-19 ENCOUNTER — Other Ambulatory Visit (HOSPITAL_COMMUNITY)
Admission: RE | Admit: 2020-03-19 | Discharge: 2020-03-19 | Disposition: A | Discharge status: Home / Self Care | Payer: BC Managed Care – PPO | Source: Ambulatory Visit | Attending: Family Medicine | Admitting: Family Medicine

## 2020-03-19 VITALS — BP 126/76 | HR 95 | Temp 98.4°F | Resp 18 | Ht 63.0 in | Wt 179.3 lb

## 2020-03-19 DIAGNOSIS — Z8742 Personal history of other diseases of the female genital tract: Secondary | ICD-10-CM

## 2020-03-19 DIAGNOSIS — Z124 Encounter for screening for malignant neoplasm of cervix: Secondary | ICD-10-CM | POA: Diagnosis present

## 2020-03-19 DIAGNOSIS — Z862 Personal history of diseases of the blood and blood-forming organs and certain disorders involving the immune mechanism: Secondary | ICD-10-CM

## 2020-03-19 DIAGNOSIS — Z131 Encounter for screening for diabetes mellitus: Secondary | ICD-10-CM

## 2020-03-19 DIAGNOSIS — E785 Hyperlipidemia, unspecified: Secondary | ICD-10-CM

## 2020-03-19 DIAGNOSIS — Z113 Encounter for screening for infections with a predominantly sexual mode of transmission: Secondary | ICD-10-CM

## 2020-03-19 DIAGNOSIS — Z3041 Encounter for surveillance of contraceptive pills: Secondary | ICD-10-CM

## 2020-03-19 DIAGNOSIS — D75839 Thrombocytosis, unspecified: Secondary | ICD-10-CM

## 2020-03-19 DIAGNOSIS — Z91013 Allergy to seafood: Secondary | ICD-10-CM

## 2020-03-19 DIAGNOSIS — L509 Urticaria, unspecified: Secondary | ICD-10-CM

## 2020-03-19 DIAGNOSIS — Z0000 Encounter for general adult medical examination without abnormal findings: Secondary | ICD-10-CM

## 2020-03-19 DIAGNOSIS — E611 Iron deficiency: Secondary | ICD-10-CM

## 2020-03-19 MED ORDER — NORETHIN ACE-ETH ESTRAD-FE 1-20 MG-MCG PO TABS
1.0000 | ORAL_TABLET | Freq: Every day | ORAL | 3 refills | Status: DC
Start: 2020-03-19 — End: 2020-06-09

## 2020-03-19 MED ORDER — EPINEPHRINE 0.3 MG/0.3ML IJ SOAJ: 0.3000 mg | INTRAMUSCULAR | 1 refills | Status: AC | PRN

## 2020-03-20 LAB — COMPLETE METABOLIC PANEL WITH GFR
AG Ratio: 1.4 (calc) (ref 1.0–2.5)
ALT: 8 U/L (ref 6–29)
AST: 10 U/L (ref 10–30)
Albumin: 4.2 g/dL (ref 3.6–5.1)
Alkaline phosphatase (APISO): 75 U/L (ref 31–125)
BUN: 17 mg/dL (ref 7–25)
CO2: 23 mmol/L (ref 20–32)
Calcium: 9.4 mg/dL (ref 8.6–10.2)
Chloride: 104 mmol/L (ref 98–110)
Creat: 0.71 mg/dL (ref 0.50–1.10)
GFR, Est African American: 131 mL/min/{1.73_m2} (ref >=60)
GFR, Est Non African American: 113 mL/min/{1.73_m2} (ref >=60)
Globulin: 2.9 g/dL (calc) (ref 1.9–3.7)
Glucose, Bld: 95 mg/dL (ref 65–99)
Potassium: 3.9 mmol/L (ref 3.5–5.3)
Sodium: 139 mmol/L (ref 135–146)
Total Bilirubin: 0.2 mg/dL (ref 0.2–1.2)
Total Protein: 7.1 g/dL (ref 6.1–8.1)

## 2020-03-20 LAB — CBC WITH DIFFERENTIAL/PLATELET
Absolute Monocytes: 702 cells/uL (ref 200–950)
Basophils Absolute: 73 cells/uL (ref 0–200)
Basophils Relative: 0.6 %
Eosinophils Absolute: 121 cells/uL (ref 15–500)
Eosinophils Relative: 1 %
HCT: 36.3 % (ref 35.0–45.0)
Hemoglobin: 12.1 g/dL (ref 11.7–15.5)
Lymphs Abs: 3400 cells/uL (ref 850–3900)
MCH: 29.1 pg (ref 27.0–33.0)
MCHC: 33.3 g/dL (ref 32.0–36.0)
MCV: 87.3 fL (ref 80.0–100.0)
MPV: 10 fL (ref 7.5–12.5)
Monocytes Relative: 5.8 %
Neutro Abs: 7805 cells/uL — ABNORMAL HIGH (ref 1500–7800)
Neutrophils Relative %: 64.5 %
Platelets: 444 10*3/uL — ABNORMAL HIGH (ref 140–400)
RBC: 4.16 10*6/uL (ref 3.80–5.10)
RDW: 12.6 % (ref 11.0–15.0)
Total Lymphocyte: 28.1 %
WBC: 12.1 10*3/uL — ABNORMAL HIGH (ref 3.8–10.8)

## 2020-03-20 LAB — LIPID PANEL
Cholesterol: 169 mg/dL (ref <200)
HDL: 48 mg/dL — ABNORMAL LOW (ref >=50)
LDL Cholesterol (Calc): 91 mg/dL (calc)
Non-HDL Cholesterol (Calc): 121 mg/dL (calc) (ref <130)
Total CHOL/HDL Ratio: 3.5 (calc) (ref <5.0)
Triglycerides: 199 mg/dL — ABNORMAL HIGH (ref <150)

## 2020-03-20 LAB — HEMOGLOBIN A1C
Hgb A1c MFr Bld: 5.4 % of total Hgb (ref <5.7)
Mean Plasma Glucose: 108 mg/dL
eAG (mmol/L): 6 mmol/L

## 2020-03-20 LAB — IRON,TIBC AND FERRITIN PANEL
%SAT: 20 % (calc) (ref 16–45)
Ferritin: 47 ng/mL (ref 16–154)
Iron: 71 ug/dL (ref 40–190)
TIBC: 363 mcg/dL (calc) (ref 250–450)

## 2020-03-20 LAB — RPR: RPR Ser Ql: NONREACTIVE

## 2020-03-20 LAB — HIV ANTIBODY (ROUTINE TESTING W REFLEX): HIV 1&2 Ab, 4th Generation: NONREACTIVE

## 2020-03-21 ENCOUNTER — Encounter: Payer: Self-pay | Admitting: Family Medicine

## 2020-03-24 ENCOUNTER — Other Ambulatory Visit: Payer: Self-pay | Admitting: Family Medicine

## 2020-03-24 DIAGNOSIS — D72829 Elevated white blood cell count, unspecified: Secondary | ICD-10-CM

## 2020-03-26 LAB — CYTOLOGY - PAP
Chlamydia: NEGATIVE
Comment: NEGATIVE
Comment: NEGATIVE
Comment: NORMAL
Diagnosis: NEGATIVE
Diagnosis: REACTIVE
High risk HPV: NEGATIVE
Neisseria Gonorrhea: NEGATIVE

## 2020-04-22 LAB — CBC WITH DIFFERENTIAL/PLATELET
Absolute Monocytes: 752 cells/uL (ref 200–950)
Basophils Absolute: 53 cells/uL (ref 0–200)
Basophils Relative: 0.7 %
Eosinophils Absolute: 182 cells/uL (ref 15–500)
Eosinophils Relative: 2.4 %
HCT: 36 % (ref 35.0–45.0)
Hemoglobin: 12 g/dL (ref 11.7–15.5)
Lymphs Abs: 2584 cells/uL (ref 850–3900)
MCH: 29.1 pg (ref 27.0–33.0)
MCHC: 33.3 g/dL (ref 32.0–36.0)
MCV: 87.2 fL (ref 80.0–100.0)
MPV: 9.7 fL (ref 7.5–12.5)
Monocytes Relative: 9.9 %
Neutro Abs: 4028 cells/uL (ref 1500–7800)
Neutrophils Relative %: 53 %
Platelets: 400 10*3/uL (ref 140–400)
RBC: 4.13 10*6/uL (ref 3.80–5.10)
RDW: 13 % (ref 11.0–15.0)
Total Lymphocyte: 34 %
WBC: 7.6 10*3/uL (ref 3.8–10.8)

## 2020-06-09 ENCOUNTER — Other Ambulatory Visit: Payer: Self-pay | Admitting: Family Medicine

## 2020-06-09 DIAGNOSIS — Z3041 Encounter for surveillance of contraceptive pills: Secondary | ICD-10-CM

## 2020-08-19 ENCOUNTER — Other Ambulatory Visit: Payer: Self-pay

## 2020-08-19 ENCOUNTER — Encounter: Payer: Self-pay | Admitting: Podiatry

## 2020-08-19 ENCOUNTER — Ambulatory Visit: Payer: BC Managed Care – PPO | Admitting: Podiatry

## 2020-08-19 DIAGNOSIS — B353 Tinea pedis: Secondary | ICD-10-CM | POA: Diagnosis not present

## 2020-08-19 DIAGNOSIS — R21 Rash and other nonspecific skin eruption: Secondary | ICD-10-CM

## 2020-08-19 MED ORDER — TERBINAFINE HCL 250 MG PO TABS: 250.0000 mg | ORAL_TABLET | Freq: Every day | ORAL | 0 refills | Status: DC

## 2020-08-19 MED ORDER — TRIAMCINOLONE ACETONIDE 0.025 % EX OINT: 1.0000 "application " | TOPICAL_OINTMENT | Freq: Two times a day (BID) | CUTANEOUS | 0 refills | Status: DC

## 2020-08-19 NOTE — Patient Instructions (Signed)
Athlete's Foot  Athlete's foot (tinea pedis) is a fungal infection of the skin on your feet. It often occurs on the skin that is between or underneath your toes. It can also occur on the soles of your feet. Symptoms include itchy or white and flaky areas on the skin. The infection can spread from person to person (is contagious). It can also spread when a person's bare feet come in contact with the fungus on shower floors or on items such as shoes. Follow these instructions at home: Medicines  Apply or take over-the-counter and prescription medicines only as told by your doctor.  Apply your antifungal medicine as told by your doctor. Do not stop using the medicine even if your feet start to get better. Foot care  Do not scratch your feet.  Keep your feet dry: ? Wear cotton or wool socks. Change your socks every day or if they become wet. ? Wear shoes that allow air to move around, such as sandals or canvas tennis shoes.  Wash and dry your feet: ? Every day or as told by your doctor. ? After exercising. ? Including the area between your toes. General instructions  Do not share any of these items that touch your feet: ? Towels. ? Shoes. ? Nail clippers. ? Other personal items.  Protect your feet by wearing sandals in wet areas, such as locker rooms and shared showers.  Keep all follow-up visits as told by your doctor. This is important.  If you have diabetes, keep your blood sugar under control. Contact a doctor if:  You have a fever.  You have swelling, pain, warmth, or redness in your foot.  Your feet are not getting better with treatment.  Your symptoms get worse.  You have new symptoms. Summary  Athlete's foot is a fungal infection of the skin on your feet.  Symptoms include itchy or white and flaky areas on the skin.  Apply your antifungal medicine as told by your doctor.  Keep your feet clean and dry. This information is not intended to replace advice given  to you by your health care provider. Make sure you discuss any questions you have with your health care provider. Document Revised: 10/18/2019 Document Reviewed: 10/18/2019 Elsevier Patient Education  2021 [employer].

## 2020-08-25 NOTE — Progress Notes (Signed)
Subjective:   Patient ID: Krista [name], female   DOB: 35 y.o.   MRN: [id number]   HPI 35 year old female presents the office today for concerns of athlete's foot.  She said that she has been using regular cream which is not working and she tried over-the-counter Lotrisone.  She gets itching.  She describes a dry, scaly area in the bottom of her foot.  She has not noticed any excessive sweating.  No open sores or pustules.   Review of Systems  All other systems reviewed and are negative.    Past Medical History:  Diagnosis Date   Alopecia    Anemia    Irregular periods/menstrual cycles    STD (sexually transmitted disease) 12/2012   + Chlam    History reviewed. No pertinent surgical history.   Current Outpatient Medications:    AUROVELA FE 1/20 1-20 MG-MCG tablet, TAKE 1 TABLET BY MOUTH EVERY DAY, Disp: 84 tablet, Rfl: 3   terbinafine (LAMISIL) 250 MG tablet, Take 1 tablet (250 mg total) by mouth daily., Disp: 14 tablet, Rfl: 0   triamcinolone (KENALOG) 0.025 % ointment, Apply 1 application topically 2 (two) times daily., Disp: 30 g, Rfl: 0   EPINEPHrine (EPIPEN 2-PAK) 0.3 mg/0.3 mL IJ SOAJ injection, Inject 0.3 mg into the muscle as needed for anaphylaxis., Disp: 2 each, Rfl: 1  Allergies  Allergen Reactions   Shellfish Allergy Anaphylaxis          Objective:  Physical Exam  General: AAO x3, NAD  Dermatological: Dry, scaly, erythematous area plantar aspect of the right foot.  There is no open sores or drainage.  No pustules.  No other open lesions identified today.  Vascular: Dorsalis Pedis artery and Posterior Tibial artery pedal pulses are 2/4 bilateral with immedate capillary fill time. There is no pain with calf compression, swelling, warmth, erythema.   Neruologic: Grossly intact via light touch bilateral.   Musculoskeletal: No gross boney pedal deformities bilateral. No pain, crepitus, or limitation noted with foot and ankle range of motion bilateral.  Muscular strength 5/5 in all groups tested bilateral.  Gait: Unassisted, Nonantalgic.       Assessment:   Skin rash right foot, likely tinea     Plan:  -Treatment options discussed including all alternatives, risks, and complications -Etiology of symptoms were discussed - Discussed various treatment options.  After discussion we reviewed 2 weeks of oral Lamisil discussed side effects.  Also prescribed triamcinolone cream topically.  If no improvement skin biopsy.    Trula Slade DPM

## 2021-02-18 ENCOUNTER — Other Ambulatory Visit: Payer: Self-pay | Admitting: Family Medicine

## 2021-02-18 DIAGNOSIS — Z3041 Encounter for surveillance of contraceptive pills: Secondary | ICD-10-CM

## 2021-03-23 ENCOUNTER — Ambulatory Visit (INDEPENDENT_AMBULATORY_CARE_PROVIDER_SITE_OTHER): Payer: BC Managed Care – PPO | Admitting: Family Medicine

## 2021-03-23 ENCOUNTER — Other Ambulatory Visit: Payer: Self-pay

## 2021-03-23 ENCOUNTER — Encounter: Payer: Self-pay | Admitting: Family Medicine

## 2021-03-23 VITALS — BP 124/78 | HR 87 | Temp 98.1°F | Resp 16 | Ht 63.0 in | Wt 173.0 lb

## 2021-03-23 DIAGNOSIS — Z0000 Encounter for general adult medical examination without abnormal findings: Secondary | ICD-10-CM | POA: Diagnosis not present

## 2021-03-23 DIAGNOSIS — Z3041 Encounter for surveillance of contraceptive pills: Secondary | ICD-10-CM

## 2021-03-23 MED ORDER — NORETHIN ACE-ETH ESTRAD-FE 1-20 MG-MCG PO TABS: 1.0000 | ORAL_TABLET | Freq: Every day | ORAL | 3 refills | Status: DC

## 2021-03-23 NOTE — Patient Instructions (Addendum)
[place]  Preventive Care 28-34 Years Old, Female Preventive care refers to lifestyle choices and visits with your health care provider that can promote health and wellness. Preventive care visits are also called wellness exams. What can I expect for my preventive care visit? Counseling During your preventive care visit, your health care provider may ask about your: Medical history, including: Past medical problems. Family medical history. Pregnancy history. Current health, including: Menstrual cycle. Method of birth control. Emotional well-being. Home life and relationship well-being. Sexual activity and sexual health. Lifestyle, including: Alcohol, nicotine or tobacco, and drug use. Access to firearms. Diet, exercise, and sleep habits. Work and work Statistician. Sunscreen use. Safety issues such as seatbelt and bike helmet use. Physical exam Your health care provider may check your: Height and weight. These may be used to calculate your BMI (body mass index). BMI is a measurement that tells if you are at a healthy weight. Waist circumference. This measures the distance around your waistline. This measurement also tells if you are at a healthy weight and may help predict your risk of certain diseases, such as type 2 diabetes and high blood pressure. Heart rate and blood pressure. Body temperature. Skin for abnormal spots. What immunizations do I need? Vaccines are usually given at various ages, according to a schedule. Your health care provider will recommend vaccines for you based on your age, medical history, and lifestyle or other factors, such as travel or where you work. What tests do I need? Screening Your health care provider may recommend screening tests for certain conditions. This may include: Pelvic exam and Pap test. Lipid and cholesterol levels. Diabetes screening. This is done by checking your blood sugar (glucose) after you have not eaten for a while  (fasting). Hepatitis B test. Hepatitis C test. HIV (human immunodeficiency virus) test. STI (sexually transmitted infection) testing, if you are at risk. BRCA-related cancer screening. This may be done if you have a family history of breast, ovarian, tubal, or peritoneal cancers. Talk with your health care provider about your test results, treatment options, and if necessary, the need for more tests. Follow these instructions at home: Eating and drinking  Eat a healthy diet that includes fresh fruits and vegetables, whole grains, lean protein, and low-fat dairy products. Take vitamin and mineral supplements as recommended by your health care provider. Do not drink alcohol if: Your health care provider tells you not to drink. You are pregnant, may be pregnant, or are planning to become pregnant. If you drink alcohol: Limit how much you have to 0-1 drink a day. Know how much alcohol is in your drink. In the [place] one drink equals one 12 oz bottle of beer (355 mL), one 5 oz glass of wine (148 mL), or one 1 oz glass of hard liquor (44 mL). Lifestyle Brush your teeth every morning and night with fluoride toothpaste. Floss one time each day. Exercise for at least 30 minutes 5 or more days each week. Do not use any products that contain nicotine or tobacco. These products include cigarettes, chewing tobacco, and vaping devices, such as e-cigarettes. If you need help quitting, ask your health care provider. Do not use drugs. If you are sexually active, practice safe sex. Use a condom or other form of protection to prevent STIs. If you do not wish to become pregnant, use a form of birth control. If you plan to become pregnant, see your health care provider for a prepregnancy visit. Find healthy ways to manage stress, such as:  Meditation, yoga, or listening to music. Journaling. Talking to a trusted person. Spending time with friends and family. Minimize exposure to UV radiation to reduce your  risk of skin cancer. Safety Always wear your seat belt while driving or riding in a vehicle. Do not drive: If you have been drinking alcohol. Do not ride with someone who has been drinking. If you have been using any mind-altering substances or drugs. While texting. When you are tired or distracted. Wear a helmet and other protective equipment during sports activities. If you have firearms in your house, make sure you follow all gun safety procedures. Seek help if you have been physically or sexually abused. What's next? Go to your health care provider once a year for an annual wellness visit. Ask your health care provider how often you should have your eyes and teeth checked. Stay up to date on all vaccines. This information is not intended to replace advice given to you by your health care provider. Make sure you discuss any questions you have with your health care provider. Document Revised: 08/27/2020 Document Reviewed: 08/27/2020 Elsevier Patient Education  [place].

## 2021-03-23 NOTE — Progress Notes (Signed)
Name: Krista [name]   MRN: [id number]    DOB: 04/15/1985   Date:03/23/2021       Progress Note  Subjective  Chief Complaint  Annual Exam  HPI  Patient presents for annual CPE.  Diet: she is cooking at home 3-4 days a week and eating out the rest of the time. Trying to eat out less often.  Exercise: She states she will hire a Physiological scientist for her and her [place] Office Visit from 03/23/2021 in [facility]  AUDIT-C Score 1      Depression: Phq 9 is  negative Depression screen [facility] 2/9 03/23/2021 03/19/2020 03/15/2019 01/10/2019 09/18/2018  Decreased Interest 0 0 0 0 0  Down, Depressed, Hopeless 0 0 0 0 0  PHQ - 2 Score 0 0 0 0 0  Altered sleeping 0 - 0 0 0  Tired, decreased energy 0 - 0 0 0  Change in appetite 0 - 0 0 0  Feeling bad or failure about yourself  0 - 0 0 0  Trouble concentrating 0 - 0 0 0  Moving slowly or fidgety/restless 0 - 0 0 0  Suicidal thoughts 0 - 0 0 0  PHQ-9 Score 0 - 0 0 0  Difficult doing work/chores - - Not difficult at all Not difficult at all -   Hypertension: BP Readings from Last 3 Encounters:  03/23/21 124/78  03/19/20 126/76  03/08/19 (!) 146/95   Obesity: Wt Readings from Last 3 Encounters:  03/23/21 173 lb (78.5 kg)  03/19/20 179 lb 4.8 oz (81.3 kg)  03/15/19 160 lb (72.6 kg)   BMI Readings from Last 3 Encounters:  03/23/21 30.65 kg/m  03/19/20 31.76 kg/m  03/15/19 28.34 kg/m     Vaccines:   Pneumonia: educated and discussed with patient. Flu: educated and discussed with patient. Patient refused   Hep C Screening: 07/30/13 STD testing and prevention (HIV/chl/gon/syphilis): 03/19/20 Intimate partner violence: negative Sexual History :same sexual partner , no vaginal discharge of pain during sex  Menstrual History/LMP/Abnormal Bleeding:  regular cycles, not ready to have a baby yet, using birth controls  Incontinence Symptoms: no problems   Breast cancer:  - Last Mammogram: N/A - BRCA  gene screening: N/A  Osteoporosis: Discussed high calcium and vitamin D supplementation, weight bearing exercises  Cervical cancer screening: 03/19/20  Skin cancer: Discussed monitoring for atypical lesions  Colorectal cancer: N/A   Lung cancer: Low Dose CT Chest recommended if Age 6-80 years, 20 pack-year currently smoking OR have quit w/in 15years. Patient does not qualify.   ECG: 01/08/19  Advanced Care Planning: A voluntary discussion about advance care planning including the explanation and discussion of advance directives.  Discussed health care proxy and Living will, and the patient was able to identify a health care proxy as sister - Krista [name]    Patient does not have a living will at present time. If patient does have living will, I have requested they bring this to the clinic to be scanned in to their chart.  Lipids: Lab Results  Component Value Date   CHOL 169 03/19/2020   CHOL 151 03/20/2018   CHOL 121 12/14/2011   Lab Results  Component Value Date   HDL 48 (L) 03/19/2020   HDL 44 (L) 03/20/2018   HDL 53 12/14/2011   Lab Results  Component Value Date   LDLCALC 91 03/19/2020   LDLCALC 80 03/20/2018   LDLCALC 56 12/14/2011   Lab Results  Component Value Date   TRIG 199 (H) 03/19/2020   TRIG 168 (H) 03/20/2018   TRIG 60 12/14/2011   Lab Results  Component Value Date   CHOLHDL 3.5 03/19/2020   CHOLHDL 3.4 03/20/2018   CHOLHDL 2.3 12/14/2011   No results found for: LDLDIRECT  Glucose: Glucose, Bld  Date Value Ref Range Status  03/19/2020 95 65 - 99 mg/dL Final    Comment:    .            Fasting reference interval .   01/06/2019 112 (H) 70 - 99 mg/dL Final  03/20/2018 92 65 - 99 mg/dL Final    Comment:    .            Fasting reference interval .     Patient Active Problem List   Diagnosis Date Noted   Alopecia areata 06/19/2014    No past surgical history on file.  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   Tobacco Use   Smoking status: Never   Smokeless tobacco: Never  Vaping Use   Vaping Use: Never used  Substance and Sexual Activity   Alcohol use: Yes    Alcohol/week: 1.0 standard drink    Types: 1 Standard drinks or equivalent per week    Comment: 1 alcoholic drinks per week   Drug use: No   Sexual activity: Yes    Partners: Male    Birth control/protection: None  Other Topics Concern   Not on file  Social History Narrative   Lives with boyfriend since 2015   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 [place]    Parents from [place] but she was born in [place] Strain: Low Risk    Difficulty of Paying Living Expenses: Not hard at all  Food Insecurity: No Food Insecurity   Worried About Charity fundraiser in the Last Year: Never true   Arboriculturist in the Last Year: Never true  Transportation Needs: No Transportation Needs   Lack of Transportation (Medical): No   Lack of Transportation (Non-Medical): No  Physical Activity: Insufficiently Active   Days of Exercise per Week: 3 days   Minutes of Exercise per Session: 30 min  Stress: No Stress Concern Present   Feeling of Stress : Only a little  Social Connections: Moderately Integrated   Frequency of Communication with Friends and Family: More than three times a week   Frequency of Social Gatherings with Friends and Family: Once a week   Attends Religious Services: Never   Marine scientist or Organizations: Yes    Attends Music therapist: More than 4 times per year   Marital Status: Living with partner  Intimate Partner Violence: Not At Risk   Fear of Current or Ex-Partner: No   Emotionally Abused: No   Physically Abused: No   Sexually  Abused: No     Current Outpatient Medications:    EPINEPHrine (EPIPEN 2-PAK) 0.3 mg/0.3 mL IJ SOAJ injection, Inject 0.3 mg into the muscle as needed for anaphylaxis., Disp: 2 each, Rfl: 1   norethindrone-ethinyl estradiol-FE (BLISOVI FE 1/20) 1-20 MG-MCG tablet, Take 1 tablet by mouth daily., Disp: 84 tablet, Rfl: 3  Allergies  Allergen Reactions   Shellfish Allergy Anaphylaxis     ROS  Constitutional: Negative for fever or weight change.  Respiratory: Negative for cough and shortness of breath.   Cardiovascular: Negative for chest pain or palpitations.  Gastrointestinal: Negative for abdominal pain, no bowel changes.  Musculoskeletal: Negative for gait problem or joint swelling.  Skin: Negative for rash.  Neurological: Negative for dizziness or headache.  No other specific complaints in a complete review of systems (except as listed in HPI above).   Objective  Vitals:   03/23/21 1527  BP: 124/78  Pulse: 87  Resp: 16  Temp: 98.1 F (36.7 C)  SpO2: 97%  Weight: 173 lb (78.5 kg)  Height: _0  (1.6 m)    Body mass index is 30.65 kg/m.  Physical Exam  Constitutional: Patient appears well-developed and well-nourished. No distress.  HENT: Head: Normocephalic and atraumatic. Ears: B TMs ok, no erythema or effusion; Nose: Not done  Mouth/Throat: not done  Eyes: Conjunctivae and EOM are normal. Pupils are equal, round, and reactive to light. No scleral icterus.  Neck: Normal range of motion. Neck supple. No JVD present. No thyromegaly present.  Cardiovascular: Normal rate, regular rhythm and normal heart sounds.  No murmur heard. No BLE edema. Pulmonary/Chest: Effort normal and breath sounds normal. No respiratory  distress. Abdominal: Soft. Bowel sounds are normal, no distension. There is no tenderness. no masses Breast: lumpy breasts, no nipple discharge or rashes FEMALE GENITALIA:  Not done e RECTAL: not done  Musculoskeletal: Normal range of motion, no joint effusions. No gross deformities Neurological: he is alert and oriented to person, place, and time. No cranial nerve deficit. Coordination, balance, strength, speech and gait are normal.  Skin: Skin is warm and dry. No rash noted. No erythema.  Psychiatric: Patient has a normal mood and affect. behavior is normal. Judgment and thought content normal.    Fall Risk: Fall Risk  03/23/2021 03/19/2020 03/15/2019 01/10/2019 09/18/2018  Falls in the past year? 0 0 0 0 0  Number falls in past yr: 0 0 0 0 0  Injury with Fall? 0 0 0 0 0  Risk for fall due to : No Fall Risks - - - -  Follow up Falls prevention discussed Falls evaluation completed - - -     Functional Status Survey: Is the patient deaf or have difficulty hearing?: No Does the patient have difficulty seeing, even when wearing glasses/contacts?: No Does the patient have difficulty concentrating, remembering, or making decisions?: No Does the patient have difficulty walking or climbing stairs?: No Does the patient have difficulty dressing or bathing?: No Does the patient have difficulty doing errands alone such as visiting a doctor's office or shopping?: No   Assessment & Plan  1. Well adult exam   2. Encounter for surveillance of contraceptive pills  - norethindrone-ethinyl estradiol-FE (BLISOVI FE 1/20) 1-20 MG-MCG tablet; Take 1 tablet by mouth daily.  Dispense: 84 tablet; Refill: 3    -USPSTF grade A and B recommendations reviewed with patient; age-appropriate recommendations, preventive care, screening tests, etc discussed and encouraged; healthy living encouraged; see AVS for patient education given to patient -Discussed  importance of 150 minutes of physical activity weekly,  eat two servings of fish weekly, eat one serving of tree nuts ( cashews, pistachios, pecans, almonds.Marland Kitchen) every other day, eat 6 servings of fruit/vegetables daily and drink plenty of water and avoid sweet beverages.

## 2021-04-15 ENCOUNTER — Encounter (HOSPITAL_COMMUNITY): Payer: Self-pay

## 2021-04-15 ENCOUNTER — Emergency Department (HOSPITAL_COMMUNITY)
Admission: EM | Admit: 2021-04-15 | Discharge: 2021-04-15 | Disposition: A | Discharge status: Home / Self Care | Payer: BC Managed Care – PPO | Attending: Emergency Medicine | Admitting: Emergency Medicine

## 2021-04-15 ENCOUNTER — Emergency Department (HOSPITAL_COMMUNITY): Payer: BC Managed Care – PPO

## 2021-04-15 DIAGNOSIS — S299XXA Unspecified injury of thorax, initial encounter: Secondary | ICD-10-CM | POA: Insufficient documentation

## 2021-04-15 DIAGNOSIS — Y92410 Unspecified street and highway as the place of occurrence of the external cause: Secondary | ICD-10-CM | POA: Diagnosis not present
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
## 2021-04-15 NOTE — ED Notes (Signed)
I provided reinforced discharge education based off of discharge instructions. Pt acknowledged and understood my education. Pt had no further questions/concerns for provider/myself. Dr.Lockwood spoke to pt and answered additional questions.
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
## 2021-04-17 ENCOUNTER — Ambulatory Visit: Payer: Self-pay

## 2021-04-17 ENCOUNTER — Encounter (HOSPITAL_COMMUNITY): Payer: Self-pay | Admitting: Emergency Medicine

## 2021-04-17 ENCOUNTER — Emergency Department (HOSPITAL_COMMUNITY): Payer: BC Managed Care – PPO

## 2021-04-17 ENCOUNTER — Emergency Department (HOSPITAL_COMMUNITY)
Admission: EM | Admit: 2021-04-17 | Discharge: 2021-04-17 | Disposition: A | Discharge status: Home / Self Care | Payer: BC Managed Care – PPO | Attending: Emergency Medicine | Admitting: Emergency Medicine

## 2021-04-17 ENCOUNTER — Telehealth: Payer: BC Managed Care – PPO | Admitting: Nurse Practitioner

## 2021-04-17 DIAGNOSIS — R519 Headache, unspecified: Secondary | ICD-10-CM | POA: Diagnosis present

## 2021-04-17 DIAGNOSIS — M542 Cervicalgia: Secondary | ICD-10-CM | POA: Insufficient documentation

## 2021-04-17 DIAGNOSIS — R1110 Vomiting, unspecified: Secondary | ICD-10-CM | POA: Insufficient documentation

## 2021-04-17 DIAGNOSIS — Y92410 Unspecified street and highway as the place of occurrence of the external cause: Secondary | ICD-10-CM | POA: Insufficient documentation

## 2021-04-17 MED ORDER — IBUPROFEN 400 MG PO TABS
600.0000 mg | ORAL_TABLET | Freq: Once | ORAL | Status: AC
  Administered 2021-04-17: 600 mg via ORAL
  Filled 2021-04-17: qty 1
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
## 2021-04-17 NOTE — ED Notes (Signed)
RN reviewed discharge instructions w/ pt. Follow up and pain management reviewed, pt had no further questions. 

## 2021-04-17 NOTE — Discharge Instructions (Signed)
The scan of your brain and neck did not show any new fracture or intracranial bleed.  Follow-up with your doctor within the week.  Take Tylenol or Motrin as needed for any additional pains.
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
## 2021-04-20 ENCOUNTER — Encounter: Payer: Self-pay | Admitting: Family Medicine

## 2021-04-21 ENCOUNTER — Encounter: Payer: Self-pay | Admitting: Family Medicine

## 2021-04-21 ENCOUNTER — Ambulatory Visit: Payer: Self-pay | Admitting: Family Medicine

## 2021-04-21 DIAGNOSIS — S134XXD Sprain of ligaments of cervical spine, subsequent encounter: Secondary | ICD-10-CM

## 2021-04-21 DIAGNOSIS — M542 Cervicalgia: Secondary | ICD-10-CM

## 2021-04-21 MED ORDER — BACLOFEN 10 MG PO TABS: 10.0000 mg | ORAL_TABLET | Freq: Four times a day (QID) | ORAL | 0 refills | Status: DC
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
## 2022-03-23 NOTE — Patient Instructions (Signed)

## 2022-03-23 NOTE — Progress Notes (Unsigned)
Name: Krista [name]   MRN: [id number]    DOB: 1985-09-13   Date:03/23/2022       Progress Note  Subjective  Chief Complaint  Annual Exam  HPI  Patient presents for annual CPE.  Diet: *** Exercise: ***  Last Eye Exam: *** Last Dental Exam: ***  Flowsheet Row Office Visit from 03/23/2021 in [facility]  AUDIT-C Score 1      Depression: Phq 9 is  {Desc; negative/positive:13464}    04/21/2021    8:40 AM 03/23/2021    3:27 PM 03/19/2020    3:04 PM 03/15/2019    9:54 AM 01/10/2019    9:49 AM  Depression screen PHQ 2/9  Decreased Interest 1 0 0 0 0  Down, Depressed, Hopeless 1 0 0 0 0  PHQ - 2 Score 2 0 0 0 0  Altered sleeping 1 0  0 0  Tired, decreased energy 1 0  0 0  Change in appetite 1 0  0 0  Feeling bad or failure about yourself  0 0  0 0  Trouble concentrating 0 0  0 0  Moving slowly or fidgety/restless 0 0  0 0  Suicidal thoughts 0 0  0 0  PHQ-9 Score 5 0  0 0  Difficult doing work/chores    Not difficult at all Not difficult at all   Hypertension: BP Readings from Last 3 Encounters:  04/21/21 128/82  04/17/21 (!) 170/106  04/15/21 (!) 160/103   Obesity: Wt Readings from Last 3 Encounters:  04/21/21 171 lb (77.6 kg)  03/23/21 173 lb (78.5 kg)  03/19/20 179 lb 4.8 oz (81.3 kg)   BMI Readings from Last 3 Encounters:  04/21/21 30.29 kg/m  03/23/21 30.65 kg/m  03/19/20 31.76 kg/m     Vaccines:   HPV: up to date Tdap: up to date Shingrix: N/A Pneumonia: N/A Flu: N/A COVID-19: 06/06/19 J&J   Hep C Screening: 07/30/13 STD testing and prevention (HIV/chl/gon/syphilis): 03/19/20 Intimate partner violence: negative screen  Sexual History : Menstrual History/LMP/Abnormal Bleeding:  Discussed importance of follow up if any post-menopausal bleeding: not applicable  Incontinence Symptoms: negative for symptoms   Breast cancer:  - Last Mammogram: N/A - BRCA gene screening: N/A   Osteoporosis Prevention : Discussed high calcium and  vitamin D supplementation, weight bearing exercises Bone density: N/A   Cervical cancer screening: 03/19/20  Skin cancer: Discussed monitoring for atypical lesions  Colorectal cancer: N/A   Lung cancer:  Low Dose CT Chest recommended if Age 71-80 years, 52 pack-year currently smoking OR have quit w/in 15years. Patient does not qualify for screen   ECG: 04/20/21  Advanced Care Planning: A voluntary discussion about advance care planning including the explanation and discussion of advance directives.  Discussed health care proxy and Living will, and the patient was able to identify a health care proxy as ***.  Patient {DOES_DOES [id number]} have a living will and power of attorney of health care   Lipids: Lab Results  Component Value Date   CHOL 169 03/19/2020   CHOL 151 03/20/2018   CHOL 121 12/14/2011   Lab Results  Component Value Date   HDL 48 (L) 03/19/2020   HDL 44 (L) 03/20/2018   HDL 53 12/14/2011   Lab Results  Component Value Date   LDLCALC 91 03/19/2020   LDLCALC 80 03/20/2018   LDLCALC 56 12/14/2011   Lab Results  Component Value Date   TRIG 199 (H) 03/19/2020   TRIG 168 (H) 03/20/2018  TRIG 60 12/14/2011   Lab Results  Component Value Date   CHOLHDL 3.5 03/19/2020   CHOLHDL 3.4 03/20/2018   CHOLHDL 2.3 12/14/2011   No results found for: "LDLDIRECT"  Glucose: Glucose, Bld  Date Value Ref Range Status  03/19/2020 95 65 - 99 mg/dL Final    Comment:    .            Fasting reference interval .   01/06/2019 112 (H) 70 - 99 mg/dL Final  03/20/2018 92 65 - 99 mg/dL Final    Comment:    .            Fasting reference interval .     Patient Active Problem List   Diagnosis Date Noted   Alopecia areata 06/19/2014    No past surgical history on file.  Family History  Problem Relation Age of Onset   Diabetes Maternal Grandfather    Stomach cancer Paternal Grandmother     Social History   Socioeconomic History   Marital status: Single    Spouse  name: Not on file   Number of children: 0   Years of education: Not on file   Highest education level: Bachelor's degree (e.g., BA, AB, BS)  Occupational History   Occupation: Associate Professor: [employer]    Comment: school systerm   Tobacco Use   Smoking status: Never   Smokeless tobacco: Never  Vaping Use   Vaping Use: Never used  Substance and Sexual Activity   Alcohol use: Yes    Alcohol/week: 1.0 standard drink of alcohol    Types: 1 Standard drinks or equivalent per week    Comment: 1 alcoholic drinks per week   Drug use: No   Sexual activity: Yes    Partners: Male    Birth control/protection: None  Other Topics Concern   Not on file  Social History Narrative   Lives with boyfriend since 2015   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 [place]    Parents from [place] but she was born in [place] Strain: Low Risk  (03/23/2021)   Overall Financial Resource Strain (CARDIA)    Difficulty of Paying Living Expenses: Not hard at all  Food Insecurity: No Food Insecurity (03/23/2021)   Hunger Vital Sign    Worried About [place] in the Last Year: Never true    [place] in the Last Year: Never true  Transportation Needs: No Transportation Needs (03/23/2021)   PRAPARE - Hydrologist (Medical): No    Lack of Transportation (Non-Medical): No  Physical Activity: Insufficiently Active (03/23/2021)   Exercise Vital Sign    Days of Exercise per Week: 3 days    Minutes of Exercise per Session: 30 min  Stress: No Stress Concern Present (03/23/2021)   [place]    Feeling of Stress : Only a little  Social Connections: Moderately Integrated (03/23/2021)   Social Connection and Isolation Panel [NHANES]    Frequency of Communication with Friends  and Family: More than three times a week    Frequency of Social Gatherings with Friends and Family: Once a week    Attends Religious Services: Never    Marine scientist or Organizations: Yes    Attends  Club or Organization Meetings: More than 4 times per year    Marital Status: Living with partner  Intimate Partner Violence: Not At Risk (03/23/2021)   Humiliation, Afraid, Rape, and Kick questionnaire    Fear of Current or Ex-Partner: No    Emotionally Abused: No    Physically Abused: No    Sexually Abused: No     Current Outpatient Medications:    baclofen (LIORESAL) 10 MG tablet, Take 1 tablet (10 mg total) by mouth 4 (four) times daily., Disp: 40 each, Rfl: 0   EPINEPHrine (EPIPEN 2-PAK) 0.3 mg/0.3 mL IJ SOAJ injection, Inject 0.3 mg into the muscle as needed for anaphylaxis., Disp: 2 each, Rfl: 1   norethindrone-ethinyl estradiol-FE (BLISOVI FE 1/20) 1-20 MG-MCG tablet, Take 1 tablet by mouth daily., Disp: 84 tablet, Rfl: 3  Allergies  Allergen Reactions   Shellfish Allergy Anaphylaxis     ROS  ***  Objective  There were no vitals filed for this visit.  There is no height or weight on file to calculate BMI.  Physical Exam ***  No results found for this or any previous visit (from the past 2160 hour(s)).   Fall Risk:    04/21/2021    8:40 AM 03/23/2021    3:27 PM 03/19/2020    3:03 PM 03/15/2019    9:54 AM 01/10/2019    9:49 AM  Fall Risk   Falls in the past year? 0 0 0 0 0  Number falls in past yr: 0 0 0 0 0  Injury with Fall? 0 0 0 0 0  Risk for fall due to : No Fall Risks No Fall Risks     Follow up Falls prevention discussed Falls prevention discussed Falls evaluation completed       Functional Status Survey:     Assessment & Plan  1. Well adult exam ***   -USPSTF grade A and B recommendations reviewed with patient; age-appropriate recommendations, preventive care, screening tests, etc discussed and encouraged; healthy living encouraged; see  AVS for patient education given to patient -Discussed importance of 150 minutes of physical activity weekly, eat two servings of fish weekly, eat one serving of tree nuts ( cashews, pistachios, pecans, almonds.Marland Kitchen) every other day, eat 6 servings of fruit/vegetables daily and drink plenty of water and avoid sweet beverages.   -Reviewed Health Maintenance: Yes.

## 2022-03-24 ENCOUNTER — Ambulatory Visit (INDEPENDENT_AMBULATORY_CARE_PROVIDER_SITE_OTHER): Payer: BC Managed Care – PPO | Admitting: Family Medicine

## 2022-03-24 ENCOUNTER — Encounter: Payer: Self-pay | Admitting: Family Medicine

## 2022-03-24 VITALS — BP 172/108 | HR 92 | Temp 98.4°F | Resp 14 | Ht 63.0 in | Wt 171.2 lb

## 2022-03-24 DIAGNOSIS — F439 Reaction to severe stress, unspecified: Secondary | ICD-10-CM

## 2022-03-24 DIAGNOSIS — R030 Elevated blood-pressure reading, without diagnosis of hypertension: Secondary | ICD-10-CM | POA: Diagnosis not present

## 2022-03-24 DIAGNOSIS — Z8639 Personal history of other endocrine, nutritional and metabolic disease: Secondary | ICD-10-CM

## 2022-03-24 DIAGNOSIS — N6312 Unspecified lump in the right breast, upper inner quadrant: Secondary | ICD-10-CM

## 2022-03-24 DIAGNOSIS — Z79899 Other long term (current) drug therapy: Secondary | ICD-10-CM

## 2022-03-24 DIAGNOSIS — Z13220 Encounter for screening for lipoid disorders: Secondary | ICD-10-CM | POA: Diagnosis not present

## 2022-03-24 DIAGNOSIS — Z1231 Encounter for screening mammogram for malignant neoplasm of breast: Secondary | ICD-10-CM

## 2022-03-24 DIAGNOSIS — Z0000 Encounter for general adult medical examination without abnormal findings: Secondary | ICD-10-CM

## 2022-03-24 DIAGNOSIS — Z131 Encounter for screening for diabetes mellitus: Secondary | ICD-10-CM

## 2022-03-24 DIAGNOSIS — N6029 Fibroadenosis of unspecified breast: Secondary | ICD-10-CM

## 2022-03-25 ENCOUNTER — Other Ambulatory Visit: Payer: Self-pay | Admitting: Family Medicine

## 2022-03-25 DIAGNOSIS — Z3041 Encounter for surveillance of contraceptive pills: Secondary | ICD-10-CM

## 2022-03-25 LAB — COMPLETE METABOLIC PANEL WITH GFR
AG Ratio: 1.5 (calc) (ref 1.0–2.5)
ALT: 12 U/L (ref 6–29)
AST: 13 U/L (ref 10–30)
Albumin: 4.4 g/dL (ref 3.6–5.1)
Alkaline phosphatase (APISO): 77 U/L (ref 31–125)
BUN: 17 mg/dL (ref 7–25)
CO2: 24 mmol/L (ref 20–32)
Calcium: 9.2 mg/dL (ref 8.6–10.2)
Chloride: 102 mmol/L (ref 98–110)
Creat: 0.68 mg/dL (ref 0.50–0.97)
Globulin: 2.9 g/dL (calc) (ref 1.9–3.7)
Glucose, Bld: 87 mg/dL (ref 65–99)
Potassium: 4.3 mmol/L (ref 3.5–5.3)
Sodium: 138 mmol/L (ref 135–146)
Total Bilirubin: 0.3 mg/dL (ref 0.2–1.2)
Total Protein: 7.3 g/dL (ref 6.1–8.1)
eGFR: 117 mL/min/{1.73_m2} (ref >=60)

## 2022-03-25 LAB — LIPID PANEL
Cholesterol: 189 mg/dL (ref <200)
HDL: 50 mg/dL (ref >=50)
LDL Cholesterol (Calc): 104 mg/dL (calc) — ABNORMAL HIGH
Non-HDL Cholesterol (Calc): 139 mg/dL (calc) — ABNORMAL HIGH (ref <130)
Total CHOL/HDL Ratio: 3.8 (calc) (ref <5.0)
Triglycerides: 234 mg/dL — ABNORMAL HIGH (ref <150)

## 2022-03-25 LAB — CBC WITH DIFFERENTIAL/PLATELET
Absolute Monocytes: 600 cells/uL (ref 200–950)
Basophils Absolute: 65 cells/uL (ref 0–200)
Basophils Relative: 0.6 %
Eosinophils Absolute: 164 cells/uL (ref 15–500)
Eosinophils Relative: 1.5 %
HCT: 37.2 % (ref 35.0–45.0)
Hemoglobin: 12.6 g/dL (ref 11.7–15.5)
Lymphs Abs: 3739 cells/uL (ref 850–3900)
MCH: 29.4 pg (ref 27.0–33.0)
MCHC: 33.9 g/dL (ref 32.0–36.0)
MCV: 86.7 fL (ref 80.0–100.0)
MPV: 10 fL (ref 7.5–12.5)
Monocytes Relative: 5.5 %
Neutro Abs: 6333 cells/uL (ref 1500–7800)
Neutrophils Relative %: 58.1 %
Platelets: 434 10*3/uL — ABNORMAL HIGH (ref 140–400)
RBC: 4.29 10*6/uL (ref 3.80–5.10)
RDW: 12.7 % (ref 11.0–15.0)
Total Lymphocyte: 34.3 %
WBC: 10.9 10*3/uL — ABNORMAL HIGH (ref 3.8–10.8)

## 2022-03-25 LAB — IRON,TIBC AND FERRITIN PANEL
%SAT: 18 % (calc) (ref 16–45)
Ferritin: 41 ng/mL (ref 16–154)
Iron: 74 ug/dL (ref 40–190)
TIBC: 422 mcg/dL (calc) (ref 250–450)

## 2022-03-25 LAB — HEMOGLOBIN A1C
Hgb A1c MFr Bld: 5.5 % of total Hgb (ref <5.7)
Mean Plasma Glucose: 111 mg/dL
eAG (mmol/L): 6.2 mmol/L
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
## 2022-03-26 NOTE — Progress Notes (Unsigned)
Name: Krista [name]   MRN: [id number]    DOB: [birth year]   Date:03/29/2022       Progress Note  Subjective  Chief Complaint  Blood Pressure Follow-Up  HPI  HTN: she was seen last week for a CPE and bp was elevated, she has been monitoring at home and since last visit bp was checked multiple times and ranged from 135-166/90-110, usually in the 150 range. She denies chest pain , palpitation or SOB. She has changed her diet, avoiding processed food, cutting down on caffeine. She is afraid to start bp medication. Reviewed labs with her , we will add urine micro and we will also check renal artery [place] to rule out secondary causes of HTN. She is willing to try HCTZ and will return sooner if bp remains elevated   Depression: Phq 9 is  negative ,  she is under more stress - moved during the holidays, they are going to have another person under the care of her significant other. She is going to integrative psychiatric care in [place], getting evaluated for ADD , had recent TSH that was normal . Explained to her stress can be a risk for HTN  Obesity: she has changed her diet and is trying to lose weight to get BP under control   Patient Active Problem List   Diagnosis Date Noted   Alopecia areata 06/19/2014    No past surgical history on file.  Family History  Problem Relation Age of Onset   Diabetes Maternal Grandfather    Stomach cancer Paternal Grandmother     Social History   Tobacco Use   Smoking status: Never   Smokeless tobacco: Never  Substance Use Topics   Alcohol use: Yes    Alcohol/week: 1.0 standard drink of alcohol    Types: 1 Standard drinks or equivalent per week    Comment: 1 alcoholic drinks per week     Current Outpatient Medications:    EPINEPHrine (EPIPEN 2-PAK) 0.3 mg/0.3 mL IJ SOAJ injection, Inject 0.3 mg into the muscle as needed for anaphylaxis., Disp: 2 each, Rfl: 1   norethindrone-ethinyl estradiol-FE (BLISOVI FE 1/20) 1-20 MG-MCG tablet, TAKE 1  TABLET BY MOUTH DAILY, Disp: 84 tablet, Rfl: 0  Allergies  Allergen Reactions   Shellfish Allergy Anaphylaxis    I personally reviewed active problem list, medication list, allergies, family history, social history, health maintenance with the patient/caregiver today.   ROS  Constitutional: Negative for fever or weight change.  Respiratory: Negative for cough and shortness of breath.   Cardiovascular: Negative for chest pain or palpitations.  Gastrointestinal: Negative for abdominal pain, no bowel changes.  Musculoskeletal: Negative for gait problem or joint swelling.  Skin: Negative for rash.  Neurological: Negative for dizziness or headache.  No other specific complaints in a complete review of systems (except as listed in HPI above).   Objective  Vitals:   03/29/22 1416 03/29/22 1426  BP: (!) 168/106 (!) 164/102  Pulse: 97   Resp: 16   SpO2: 98%   Weight: 171 lb (77.6 kg)   Height: '5\' 3"'$  (1.6 m)     Body mass index is 30.29 kg/m.  Physical Exam  Constitutional: Patient appears well-developed and well-nourished. Obese  No distress.  HEENT: head atraumatic, normocephalic, pupils equal and reactive to light, neck supple Cardiovascular: Normal rate, regular rhythm and normal heart sounds.  No murmur heard. No BLE edema. Pulmonary/Chest: Effort normal and breath sounds normal. No respiratory distress. Abdominal: Soft.  There is no  tenderness. Psychiatric: Patient has a normal mood and affect. behavior is normal. Judgment and thought content normal.   Recent Results (from the past 2160 hour(s))  Lipid panel     Status: Abnormal   Collection Time: 03/24/22  3:54 PM  Result Value Ref Range   Cholesterol 189 <200 mg/dL   HDL 50 > OR = 50 mg/dL   Triglycerides 234 (H) <150 mg/dL    Comment: . If a non-fasting specimen was collected, consider repeat triglyceride testing on a fasting specimen if clinically indicated.  Yates Decamp et al. J. of Clin. Lipidol.  6073;7:106-269. Marland Kitchen    LDL Cholesterol (Calc) 104 (H) mg/dL (calc)    Comment: Reference range: <100 . Desirable range <100 mg/dL for primary prevention;   <70 mg/dL for patients with CHD or diabetic patients  with > or = 2 CHD risk factors. Marland Kitchen LDL-C is now calculated using the Martin-Hopkins  calculation, which is a validated novel method providing  better accuracy than the Friedewald equation in the  estimation of LDL-C.  Cresenciano Genre et al. Annamaria Helling. 4854;627(03): 2061-2068  (http://education.[facility].com/faq/FAQ164)    Total CHOL/HDL Ratio 3.8 <5.0 (calc)   Non-HDL Cholesterol (Calc) 139 (H) <130 mg/dL (calc)    Comment: For patients with diabetes plus 1 major ASCVD risk  factor, treating to a non-HDL-C goal of <100 mg/dL  (LDL-C of <70 mg/dL) is considered a therapeutic  option.   COMPLETE METABOLIC PANEL WITH GFR     Status: None   Collection Time: 03/24/22  3:54 PM  Result Value Ref Range   Glucose, Bld 87 65 - 99 mg/dL    Comment: .            Fasting reference interval .    BUN 17 7 - 25 mg/dL   Creat 0.68 0.50 - 0.97 mg/dL   eGFR 117 > OR = 60 mL/min/1.79m   BUN/Creatinine Ratio SEE NOTE: 6 - 22 (calc)    Comment:    Not Reported: BUN and Creatinine are within    reference range. .    Sodium 138 135 - 146 mmol/L   Potassium 4.3 3.5 - 5.3 mmol/L   Chloride 102 98 - 110 mmol/L   CO2 24 20 - 32 mmol/L   Calcium 9.2 8.6 - 10.2 mg/dL   Total Protein 7.3 6.1 - 8.1 g/dL   Albumin 4.4 3.6 - 5.1 g/dL   Globulin 2.9 1.9 - 3.7 g/dL (calc)   AG Ratio 1.5 1.0 - 2.5 (calc)   Total Bilirubin 0.3 0.2 - 1.2 mg/dL   Alkaline phosphatase (APISO) 77 31 - 125 U/L   AST 13 10 - 30 U/L   ALT 12 6 - 29 U/L  CBC with Differential/Platelet     Status: Abnormal   Collection Time: 03/24/22  3:54 PM  Result Value Ref Range   WBC 10.9 (H) 3.8 - 10.8 Thousand/uL   RBC 4.29 3.80 - 5.10 Million/uL   Hemoglobin 12.6 11.7 - 15.5 g/dL   HCT 37.2 35.0 - 45.0 %   MCV 86.7 80.0 - 100.0  fL   MCH 29.4 27.0 - 33.0 pg   MCHC 33.9 32.0 - 36.0 g/dL   RDW 12.7 11.0 - 15.0 %   Platelets 434 (H) 140 - 400 Thousand/uL   MPV 10.0 7.5 - 12.5 fL   Neutro Abs 6,333 1,500 - 7,800 cells/uL   Lymphs Abs 3,739 850 - 3,900 cells/uL   Absolute Monocytes 600 200 - 950 cells/uL   Eosinophils Absolute  164 15 - 500 cells/uL   Basophils Absolute 65 0 - 200 cells/uL   Neutrophils Relative % 58.1 %   Total Lymphocyte 34.3 %   Monocytes Relative 5.5 %   Eosinophils Relative 1.5 %   Basophils Relative 0.6 %  Iron, TIBC and Ferritin Panel     Status: None   Collection Time: 03/24/22  3:54 PM  Result Value Ref Range   Iron 74 40 - 190 mcg/dL   TIBC 422 250 - 450 mcg/dL (calc)   %SAT 18 16 - 45 % (calc)   Ferritin 41 16 - 154 ng/mL  Hemoglobin A1c     Status: None   Collection Time: 03/24/22  3:54 PM  Result Value Ref Range   Hgb A1c MFr Bld 5.5 <5.7 % of total Hgb    Comment: For the purpose of screening for the presence of diabetes: . <5.7%       Consistent with the absence of diabetes 5.7-6.4%    Consistent with increased risk for diabetes             (prediabetes) > or =6.5%  Consistent with diabetes . This assay result is consistent with a decreased risk of diabetes. . Currently, no consensus exists regarding use of hemoglobin A1c for diagnosis of diabetes in children. . According to American Diabetes Association (ADA) guidelines, hemoglobin A1c <7.0% represents optimal control in non-pregnant diabetic patients. Different metrics may apply to specific patient populations.  Standards of Medical Care in Diabetes(ADA). .    Mean Plasma Glucose 111 mg/dL   eAG (mmol/L) 6.2 mmol/L    Comment: HbA1c performed on Roche platform. Effective 11/09/21 a change in test platforms may have  shifted HbA1c results compared to historical results.     PHQ2/9:    03/29/2022    2:16 PM 03/24/2022    3:19 PM 04/21/2021    8:40 AM 03/23/2021    3:27 PM 03/19/2020    3:04 PM  Depression  screen PHQ 2/9  Decreased Interest 0 1 1 0 0  Down, Depressed, Hopeless 1 0 1 0 0  PHQ - 2 Score '1 1 2 '$ 0 0  Altered sleeping 3 0 1 0   Tired, decreased energy 3 0 1 0   Change in appetite 0 0 1 0   Feeling bad or failure about yourself  0 0 0 0   Trouble concentrating 0 0 0 0   Moving slowly or fidgety/restless 0 0 0 0   Suicidal thoughts 0 0 0 0   PHQ-9 Score '7 1 5 '$ 0   Difficult doing work/chores  Not difficult at all       phq 9 is positive   Fall Risk:    03/29/2022    2:15 PM 03/24/2022    3:19 PM 04/21/2021    8:40 AM 03/23/2021    3:27 PM 03/19/2020    3:03 PM  Fall Risk   Falls in the past year? 0 0 0 0 0  Number falls in past yr: 0  0 0 0  Injury with Fall? 0  0 0 0  Risk for fall due to : No Fall Risks No Fall Risks No Fall Risks No Fall Risks   Follow up Falls prevention discussed Falls prevention discussed;Education provided;Falls evaluation completed Falls prevention discussed Falls prevention discussed Falls evaluation completed      Functional Status Survey: Is the patient deaf or have difficulty hearing?: No Does the patient have difficulty seeing, even when wearing  glasses/contacts?: No Does the patient have difficulty concentrating, remembering, or making decisions?: Yes Does the patient have difficulty walking or climbing stairs?: No Does the patient have difficulty dressing or bathing?: No Does the patient have difficulty doing errands alone such as visiting a doctor's office or shopping?: No    Assessment & Plan  1. Uncontrolled hypertension  - Microalbumin / creatinine urine ratio - VAS US RENAL ARTERY DUPLEX; Future - hydrochlorothiazide (HYDRODIURIL) 12.5 MG tablet; Take 1 tablet (12.5 mg total) by mouth daily.  Dispense: 90 tablet; Refill: 0  2. Encounter for surveillance of contraceptive pills  - norethindrone-ethinyl estradiol-FE (BLISOVI FE 1/20) 1-20 MG-MCG tablet; Take 1 tablet by mouth daily.  Dispense: 84 tablet; Refill: 3  3.  Stress  Going to psychiatrist

## 2022-03-29 ENCOUNTER — Ambulatory Visit (INDEPENDENT_AMBULATORY_CARE_PROVIDER_SITE_OTHER): Payer: BC Managed Care – PPO | Admitting: Family Medicine

## 2022-03-29 ENCOUNTER — Encounter: Payer: Self-pay | Admitting: Family Medicine

## 2022-03-29 VITALS — BP 164/102 | HR 97 | Resp 16 | Ht 63.0 in | Wt 171.0 lb

## 2022-03-29 DIAGNOSIS — F439 Reaction to severe stress, unspecified: Secondary | ICD-10-CM

## 2022-03-29 DIAGNOSIS — Z3041 Encounter for surveillance of contraceptive pills: Secondary | ICD-10-CM | POA: Diagnosis not present

## 2022-03-29 DIAGNOSIS — I10 Essential (primary) hypertension: Secondary | ICD-10-CM | POA: Diagnosis not present

## 2022-03-29 MED ORDER — NORETHIN ACE-ETH ESTRAD-FE 1-20 MG-MCG PO TABS: 1.0000 | ORAL_TABLET | Freq: Every day | ORAL | 3 refills | Status: DC

## 2022-03-29 MED ORDER — HYDROCHLOROTHIAZIDE 12.5 MG PO TABS: 12.5000 mg | ORAL_TABLET | Freq: Every day | ORAL | 0 refills | Status: DC

## 2022-03-29 NOTE — Patient Instructions (Signed)

## 2022-03-30 LAB — MICROALBUMIN / CREATININE URINE RATIO
Creatinine, Urine: 122 mg/dL (ref 20–275)
Microalb Creat Ratio: 4 mcg/mg creat (ref <30)
Microalb, Ur: 0.5 mg/dL

## 2022-03-31 ENCOUNTER — Other Ambulatory Visit: Payer: Self-pay

## 2022-03-31 ENCOUNTER — Telehealth: Payer: Self-pay | Admitting: Family Medicine

## 2022-03-31 NOTE — Telephone Encounter (Signed)
Referral Request - Has patient seen PCP for this complaint? yes *If NO, is insurance requiring patient see PCP for this issue before PCP can refer them? Referral for which specialty: mammogram Preferred provider/office: [facility]/ fx [phone] Reason for referral: 1st mammogram

## 2022-04-01 ENCOUNTER — Other Ambulatory Visit: Payer: Self-pay

## 2022-04-01 DIAGNOSIS — Z1231 Encounter for screening mammogram for malignant neoplasm of breast: Secondary | ICD-10-CM

## 2022-04-05 ENCOUNTER — Ambulatory Visit (HOSPITAL_COMMUNITY)
Admission: RE | Admit: 2022-04-05 | Discharge: 2022-04-05 | Disposition: A | Discharge status: Home / Self Care | Payer: BC Managed Care – PPO | Source: Ambulatory Visit | Attending: Family Medicine | Admitting: Family Medicine

## 2022-04-05 DIAGNOSIS — I10 Essential (primary) hypertension: Secondary | ICD-10-CM | POA: Insufficient documentation

## 2022-04-29 NOTE — Telephone Encounter (Unsigned)
Copied from [place]. Topic: General - Other >> Apr 29, 2022  9:33 AM [place] B wrote: Reason for CRM: Patient called in states Dr Ancil Boozer wanted her to have a mammogram because of lump felt in breast but she states the place recommended wont see her unless there is an official d of why and she isn't of age and an ultrasound being done.

## 2022-04-30 ENCOUNTER — Encounter: Payer: Self-pay | Admitting: Family Medicine

## 2022-04-30 NOTE — Telephone Encounter (Signed)
Addended note and added diagnosis codes.

## 2022-06-24 ENCOUNTER — Other Ambulatory Visit: Payer: Self-pay | Admitting: Family Medicine

## 2022-06-24 DIAGNOSIS — I10 Essential (primary) hypertension: Secondary | ICD-10-CM

## 2022-07-05 ENCOUNTER — Ambulatory Visit: Payer: BC Managed Care – PPO | Admitting: Family Medicine

## 2022-07-05 NOTE — Progress Notes (Unsigned)
Name: Pierre Cumpton   MRN: [id number]    DOB: 06/22/1985   Date:07/06/2022       Progress Note  Subjective  Chief Complaint  Follow Up  HPI  HTN: BP at home was ranging  135-166/90-110, usually in the 150 range and also high in our office multiple times.  She denies chest pain , palpitation or SOB. She has changed her diet, avoiding processed food, cutting down on caffeine, drinking vegetables smoothies that she prepares at home , when she goes out eating half of the portions, she is no longer drinking margaritas when she goes out. We gave her HCTZ, she states bp is now between 130's- low 140's at home today 136/84 in our office. 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  She is going to integrative psychiatric care in [place], negative for ADD and given reassurance She was diagnosed with depression as a teenager but never took medications   Obesity: she is losing weight due to life style modification. She has lost 10 lbs, she is walking and exercising at home.   Patient Active Problem List   Diagnosis Date Noted   Uncontrolled hypertension 03/29/2022   Alopecia areata 06/19/2014    No past surgical history on file.  Family History  Problem Relation Age of Onset   Diabetes Maternal Grandfather    Stomach cancer Paternal Grandmother     Social History   Tobacco Use   Smoking status: Never   Smokeless tobacco: Never  Substance Use Topics   Alcohol use: Yes    Alcohol/week: 1.0 standard drink of alcohol    Types: 1 Standard drinks or equivalent per week    Comment: 1 alcoholic drinks per week     Current Outpatient Medications:    EPINEPHrine (EPIPEN 2-PAK) 0.3 mg/0.3 mL IJ SOAJ injection, Inject 0.3 mg into the muscle as needed  for anaphylaxis., Disp: 2 each, Rfl: 1   hydrochlorothiazide (HYDRODIURIL) 12.5 MG tablet, TAKE 1 TABLET(12.5 MG) BY MOUTH DAILY, Disp: 30 tablet, Rfl: 0   norethindrone-ethinyl estradiol-FE (BLISOVI FE 1/20) 1-20 MG-MCG tablet, Take 1 tablet by mouth daily., Disp: 84 tablet, Rfl: 3  Allergies  Allergen Reactions   Shellfish Allergy Anaphylaxis    I personally reviewed active problem list, medication list, allergies, family history, social history, health maintenance with the patient/caregiver today.   ROS  Ten systems reviewed and is negative except as mentioned in HPI   Objective  Vitals:   07/06/22 0821  BP: 136/84  Pulse: 91  Resp: 16  SpO2: 98%  Weight: 161 lb (73 kg)  Height:  (1.6 m)    Body mass index is 28.52 kg/m.  Physical Exam  Constitutional: Patient appears well-developed and well-nourished.  No distress.  HEENT: head atraumatic, normocephalic, pupils equal and reactive to light, neck supple Cardiovascular: Normal rate, regular rhythm and normal heart sounds.  No murmur heard. No BLE edema. Pulmonary/Chest: Effort normal and breath sounds normal. No respiratory distress. Abdominal: Soft.  There is no tenderness. Psychiatric: Patient has a normal mood and affect. behavior is normal. Judgment and thought content normal.    PHQ2/9:    07/06/2022    8:20 AM 03/29/2022    2:16 PM 03/24/2022  3:19 PM 04/21/2021    8:40 AM 03/23/2021    3:27 PM  Depression screen PHQ 2/9  Decreased Interest 0 0 1 1 0  Down, Depressed, Hopeless 0 1 0 1 0  PHQ - 2 Score 0 0  Altered sleeping 0 3 0 1 0  Tired, decreased energy 0 3 0 1 0  Change in appetite 0 0 0 1 0  Feeling bad or failure about yourself  0 0 0 0 0  Trouble concentrating 0 0 0 0 0  Moving slowly or fidgety/restless 0 0 0 0 0  Suicidal thoughts 0 0 0 0 0  PHQ-9 Score 0 0  Difficult doing work/chores   Not difficult at all      phq 9 is negative   Fall Risk:    07/06/2022    8:20 AM  03/29/2022    2:15 PM 03/24/2022    3:19 PM 04/21/2021    8:40 AM 03/23/2021    3:27 PM  Fall Risk   Falls in the past year? 0 0 0 0 0  Number falls in past yr: 0 0  0 0  Injury with Fall? 0 0  0 0  Risk for fall due to : No Fall Risks No Fall Risks No Fall Risks No Fall Risks No Fall Risks  Follow up Falls prevention discussed Falls prevention discussed Falls prevention discussed;Education provided;Falls evaluation completed Falls prevention discussed Falls prevention discussed    Functional Status Survey: Is the patient deaf or have difficulty hearing?: No Does the patient have difficulty seeing, even when wearing glasses/contacts?: No Does the patient have difficulty concentrating, remembering, or making decisions?: No Does the patient have difficulty walking or climbing stairs?: No Does the patient have difficulty dressing or bathing?: No Does the patient have difficulty doing errands alone such as visiting a doctor's office or shopping?: No   Assessment & Plan  1. HTN (hypertension), benign  BP improved, continue HCTZ Negative renal US , normal urine micro   2. Overweight (BMI 25.0-29.9)  He is doing well   3. Stress   Coping well

## 2022-07-06 ENCOUNTER — Ambulatory Visit: Payer: BC Managed Care – PPO | Admitting: Family Medicine

## 2022-07-06 VITALS — BP 136/84 | HR 91 | Resp 16 | Ht 63.0 in | Wt 161.0 lb

## 2022-07-06 DIAGNOSIS — I10 Essential (primary) hypertension: Secondary | ICD-10-CM

## 2022-07-06 DIAGNOSIS — E663 Overweight: Secondary | ICD-10-CM | POA: Insufficient documentation

## 2022-07-06 DIAGNOSIS — F439 Reaction to severe stress, unspecified: Secondary | ICD-10-CM

## 2022-07-06 MED ORDER — HYDROCHLOROTHIAZIDE 12.5 MG PO TABS: 12.5000 mg | ORAL_TABLET | Freq: Every day | ORAL | 0 refills | Status: DC

## 2022-10-04 NOTE — Progress Notes (Unsigned)
Name: Krista [name]   MRN: [id number]    DOB: [birth year]   Date:10/05/2022       Progress Note  Subjective  Chief Complaint  Follow Up  HPI  HTN: She denies chest pain , palpitation or SOB. She has changed her diet, avoiding processed food, cutting down on caffeine, drinking vegetables smoothies that she prepares at home , when she goes out eating half of the portions, she is no longer drinking margaritas when she goes out. We gave her HCTZ, she states bp around 145 in the pm's lower in the mornings before she takes bp medication . She is maintaining her weight loss, BMI  is now below 30, but bp continues to go up. 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 She states she will try one more year and if unable to tolerate she will switch careers.  She is going to integrative psychiatric care in [place], negative for ADD and given reassurance She was diagnosed with depression as a teenager but never took medications   Overweight: BMI is below 30, continue life style modification   Patient Active Problem List   Diagnosis Date Noted   HTN (hypertension), benign 07/06/2022   Overweight (BMI 25.0-29.9) 07/06/2022   Alopecia areata 06/19/2014    History reviewed. No pertinent surgical history.  Family History  Problem Relation Age of Onset   Diabetes Maternal Grandfather    Stomach cancer Paternal Grandmother     Social History   Tobacco Use   Smoking status: Never   Smokeless tobacco: Never  Substance Use Topics   Alcohol use: Yes    Alcohol/week: 1.0 standard drink of alcohol    Types: 1 Standard drinks or equivalent per week    Comment: 1 alcoholic drinks per week     Current Outpatient Medications:    EPINEPHrine (EPIPEN  2-PAK) 0.3 mg/0.3 mL IJ SOAJ injection, Inject 0.3 mg into the muscle as needed for anaphylaxis., Disp: 2 each, Rfl: 1   norethindrone-ethinyl estradiol-FE (BLISOVI FE 1/20) 1-20 MG-MCG tablet, Take 1 tablet by mouth daily., Disp: 84 tablet, Rfl: 3   olmesartan-hydrochlorothiazide (BENICAR HCT) 20-12.5 MG tablet, Take 1 tablet by mouth daily., Disp: 30 tablet, Rfl: 0  Allergies  Allergen Reactions   Shellfish Allergy Anaphylaxis    I personally reviewed active problem list, medication list, allergies, family history, social history, health maintenance with the patient/caregiver today.   ROS  Ten systems reviewed and is negative except as mentioned in HPI    Objective  Vitals:   10/05/22 1446 10/05/22 1451 10/05/22 1513  BP: (!) 170/102 (!) 162/104 132/86  Pulse: 89    Resp: 14    Temp: 98 F (36.7 C)    TempSrc: Oral    SpO2: 99%    Weight: 164 lb 6.4 oz (74.6 kg)    Height: 5\' 3"  (1.6 m)      Body mass index is 29.12 kg/m.  Physical Exam  Constitutional: Patient appears well-developed and well-nourished.  No distress.  HEENT: head atraumatic, normocephalic, pupils equal and reactive to light, neck supple Cardiovascular: Normal rate, regular rhythm and normal heart sounds.  No murmur heard. No BLE edema. Pulmonary/Chest: Effort normal and  breath sounds normal. No respiratory distress. Abdominal: Soft.  There is no tenderness. Psychiatric: Patient has a normal mood and affect. behavior is normal. Judgment and thought content normal.    PHQ2/9:    10/05/2022    2:48 PM 07/06/2022    8:20 AM 03/29/2022    2:16 PM 03/24/2022    3:19 PM 04/21/2021    8:40 AM  Depression screen PHQ 2/9  Decreased Interest 0 0 0 1 1  Down, Depressed, Hopeless 0 0 1 0 1  PHQ - 2 Score 0 0 1 1 2   Altered sleeping 0 0 3 0 1  Tired, decreased energy 0 0 3 0 1  Change in appetite 0 0 0 0 1  Feeling bad or failure about yourself  0 0 0 0 0  Trouble concentrating 0 0 0 0 0  Moving slowly or  fidgety/restless 0 0 0 0 0  Suicidal thoughts 0 0 0 0 0  PHQ-9 Score 0 0 7 1 5   Difficult doing work/chores    Not difficult at all     phq 9 is negative   Fall Risk:    10/05/2022    2:47 PM 07/06/2022    8:20 AM 03/29/2022    2:15 PM 03/24/2022    3:19 PM 04/21/2021    8:40 AM  Fall Risk   Falls in the past year? 0 0 0 0 0  Number falls in past yr:  0 0  0  Injury with Fall?  0 0  0  Risk for fall due to : No Fall Risks No Fall Risks No Fall Risks No Fall Risks No Fall Risks  Follow up Falls prevention discussed Falls prevention discussed Falls prevention discussed Falls prevention discussed;Education provided;Falls evaluation completed Falls prevention discussed      Functional Status Survey: Is the patient deaf or have difficulty hearing?: No Does the patient have difficulty seeing, even when wearing glasses/contacts?: No Does the patient have difficulty concentrating, remembering, or making decisions?: No Does the patient have difficulty walking or climbing stairs?: No Does the patient have difficulty dressing or bathing?: No Does the patient have difficulty doing errands alone such as visiting a doctor's office or shopping?: No    Assessment & Plan  1. HTN (hypertension), benign  - BASIC METABOLIC PANEL WITH GFR - olmesartan-hydrochlorothiazide (BENICAR HCT) 20-12.5 MG tablet; Take 1 tablet by mouth daily.  Dispense: 30 tablet; Refill: 0 - TSH - Catecholamines, fractionated, plasma - Parathyroid hormone, intact (no Ca)   Advised her to take half in am and half in pm if it causes dizziness or low bp

## 2022-10-05 ENCOUNTER — Ambulatory Visit: Payer: BC Managed Care – PPO | Admitting: Family Medicine

## 2022-10-05 ENCOUNTER — Other Ambulatory Visit: Payer: Self-pay | Admitting: Family Medicine

## 2022-10-05 ENCOUNTER — Encounter: Payer: Self-pay | Admitting: Family Medicine

## 2022-10-05 VITALS — BP 132/86 | HR 89 | Temp 98.0°F | Resp 14 | Ht 63.0 in | Wt 164.4 lb

## 2022-10-05 DIAGNOSIS — E663 Overweight: Secondary | ICD-10-CM

## 2022-10-05 DIAGNOSIS — I10 Essential (primary) hypertension: Secondary | ICD-10-CM | POA: Diagnosis not present

## 2022-10-05 MED ORDER — OLMESARTAN MEDOXOMIL-HCTZ 20-12.5 MG PO TABS: 1.0000 | ORAL_TABLET | Freq: Every day | ORAL | 0 refills | Status: DC

## 2022-10-05 NOTE — Patient Instructions (Signed)
Weight and wellness clinic in [place]

## 2022-10-28 NOTE — Progress Notes (Signed)
Name: Krista [name]   MRN: [id number]    DOB: 1986/01/04   Date:10/29/2022       Progress Note  Subjective  Chief Complaint  Follow Up  HPI  HTN: She denies chest pain , palpitation or SOB. She has changed her diet, avoiding processed food, cutting down on caffeine, drinking vegetables smoothies that she prepares at home , when she goes out eating half of the portions, she is no longer drinking margaritas when she goes out. We gave her HCTZ, she states bp around 145 in the pm's lower in the mornings before she takes bp medication . She is maintaining her weight loss, BMI  is now below 30, all evaluation for secondary hypertension was negative, she is now on Benicar hydrochlorothiazide 20/12.5 mg and BP is much better down to 130/70, at home still goes up to 140's, she is feeling a little tired and would like to stay on current dose until next visit.    Patient Active Problem List   Diagnosis Date Noted   HTN (hypertension), benign 07/06/2022   Overweight (BMI 25.0-29.9) 07/06/2022   Alopecia areata 06/19/2014    No past surgical history on file.  Family History  Problem Relation Age of Onset   Diabetes Maternal Grandfather    Stomach cancer Paternal Grandmother     Social History   Tobacco Use   Smoking status: Never   Smokeless tobacco: Never  Substance Use Topics   Alcohol use: Yes    Alcohol/week: 1.0 standard drink of alcohol    Types: 1 Standard drinks or equivalent per week    Comment: 1 alcoholic drinks per week     Current Outpatient Medications:    EPINEPHrine (EPIPEN 2-PAK) 0.3 mg/0.3 mL IJ SOAJ injection, Inject 0.3 mg into the muscle as needed for anaphylaxis., Disp: 2 each, Rfl: 1   norethindrone-ethinyl estradiol-FE (BLISOVI FE 1/20) 1-20 MG-MCG tablet, Take 1 tablet by mouth daily., Disp: 84 tablet, Rfl: 3   olmesartan-hydrochlorothiazide (BENICAR HCT) 20-12.5 MG tablet, Take 1 tablet by mouth daily., Disp: 30 tablet, Rfl: 0  Allergies  Allergen  Reactions   Shellfish Allergy Anaphylaxis    I personally reviewed active problem list, medication list, allergies, family history, social history, health maintenance with the patient/caregiver today.   ROS  Ten systems reviewed and is negative except as mentioned in HPI    Objective  Vitals:   10/29/22 0925  BP: 130/76  Pulse: 90  Resp: 16  SpO2: 99%  Weight: 164 lb (74.4 kg)  Height: 5\' 3"  (1.6 m)    Body mass index is 29.05 kg/m.  Physical Exam  Constitutional: Patient appears well-developed and well-nourished. Obese  No distress.  HEENT: head atraumatic, normocephalic, pupils equal and reactive to light, neck supple Cardiovascular: Normal rate, regular rhythm and normal heart sounds.  No murmur heard. No BLE edema. Pulmonary/Chest: Effort normal and breath sounds normal. No respiratory distress. Abdominal: Soft.  There is no tenderness. Psychiatric: Patient has a normal mood and affect. behavior is normal. Judgment and thought content normal.    PHQ2/9:    10/29/2022    9:25 AM 10/05/2022    2:48 PM 07/06/2022    8:20 AM 03/29/2022    2:16 PM 03/24/2022    3:19 PM  Depression screen PHQ 2/9  Decreased Interest 0 0 0 0 1  Down, Depressed, Hopeless 0 0 0 1 0  PHQ - 2 Score 0 0 0 1 1  Altered sleeping 0 0 0 3 0  Tired, decreased energy 0 0 0 3 0  Change in appetite 0 0 0 0 0  Feeling bad or failure about yourself  0 0 0 0 0  Trouble concentrating 0 0 0 0 0  Moving slowly or fidgety/restless 0 0 0 0 0  Suicidal thoughts 0 0 0 0 0  PHQ-9 Score 0 0 0 7 1  Difficult doing work/chores     Not difficult at all    phq 9 is negative   Fall Risk:    10/29/2022    9:24 AM 10/05/2022    2:47 PM 07/06/2022    8:20 AM 03/29/2022    2:15 PM 03/24/2022    3:19 PM  Fall Risk   Falls in the past year? 0 0 0 0 0  Number falls in past yr: 0  0 0   Injury with Fall? 0  0 0   Risk for fall due to : No Fall Risks No Fall Risks No Fall Risks No Fall Risks No Fall Risks   Follow up Falls prevention discussed Falls prevention discussed Falls prevention discussed Falls prevention discussed Falls prevention discussed;Education provided;Falls evaluation completed      Functional Status Survey: Is the patient deaf or have difficulty hearing?: No Does the patient have difficulty seeing, even when wearing glasses/contacts?: No Does the patient have difficulty concentrating, remembering, or making decisions?: No Does the patient have difficulty walking or climbing stairs?: No Does the patient have difficulty dressing or bathing?: No Does the patient have difficulty doing errands alone such as visiting a doctor's office or shopping?: No    Assessment & Plan  1. HTN (hypertension), benign  - olmesartan-hydrochlorothiazide (BENICAR HCT) 20-12.5 MG tablet; Take 1 tablet by mouth daily.  Dispense: 90 tablet; Refill: 0

## 2022-10-29 ENCOUNTER — Encounter: Payer: Self-pay | Admitting: Family Medicine

## 2022-10-29 ENCOUNTER — Ambulatory Visit: Payer: BC Managed Care – PPO | Admitting: Family Medicine

## 2022-10-29 VITALS — BP 130/76 | HR 90 | Resp 16 | Ht 63.0 in | Wt 164.0 lb

## 2022-10-29 DIAGNOSIS — I10 Essential (primary) hypertension: Secondary | ICD-10-CM

## 2022-10-29 MED ORDER — OLMESARTAN MEDOXOMIL-HCTZ 20-12.5 MG PO TABS: 1.0000 | ORAL_TABLET | Freq: Every day | ORAL | 0 refills | Status: DC

## 2022-11-07 IMAGING — CT CT HEAD W/O CM
3 series · 16 of 47 positions shown, 19 images · non-contrast
Comparison: None.

CLINICAL DATA: Head trauma, moderate-severe



[Series 3: head 5.0 h30s · axial · 0.40mm/px · z∈[-122,+18]mm · 10 of 34 slices shown, 13 images]
[im 3/34  brain]
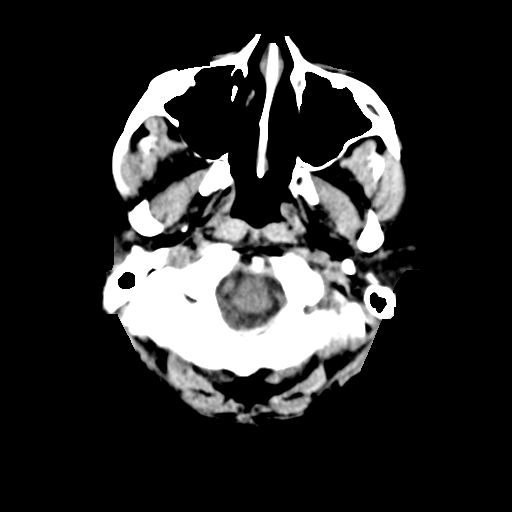
[im 3/34  bone]
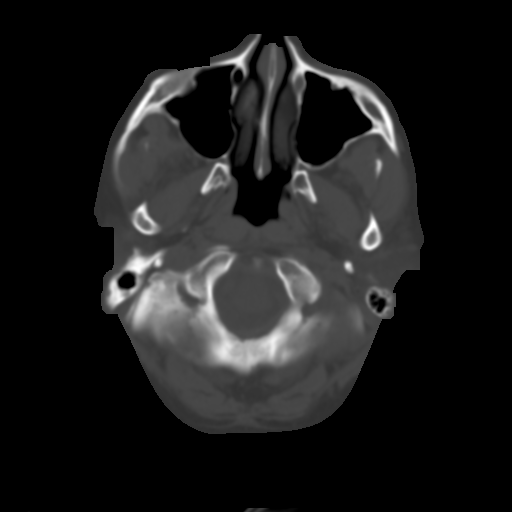
[im 6/34  brain]
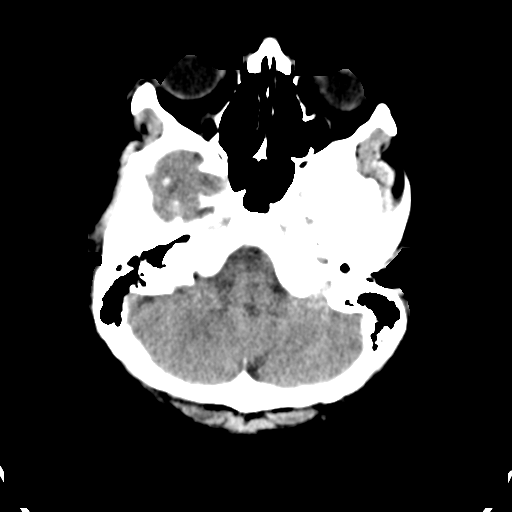
[im 10/34  brain]
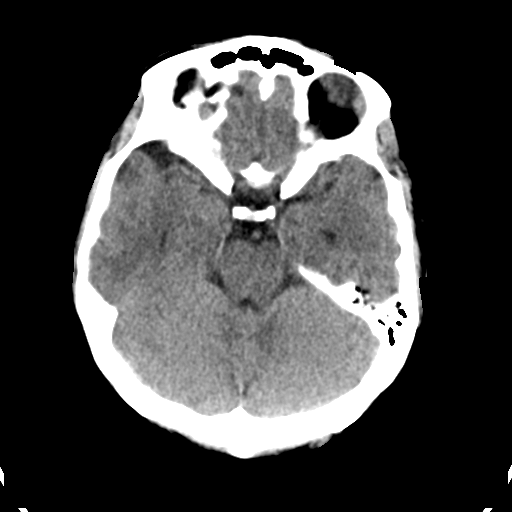
[im 12/34  brain]
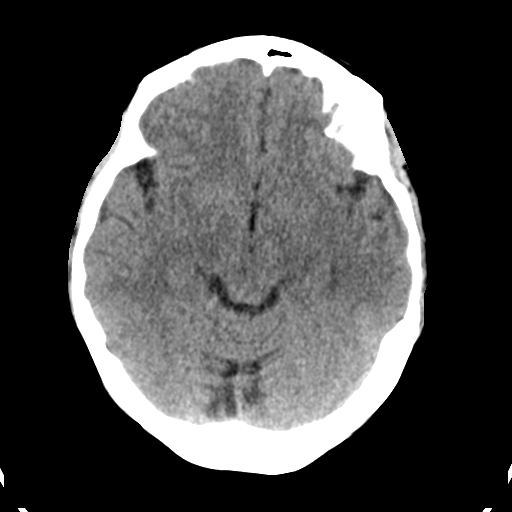
[im 15/34  brain]
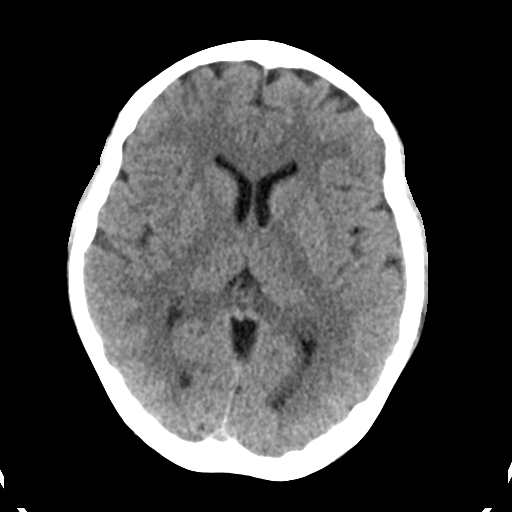
[im 15/34  bone]
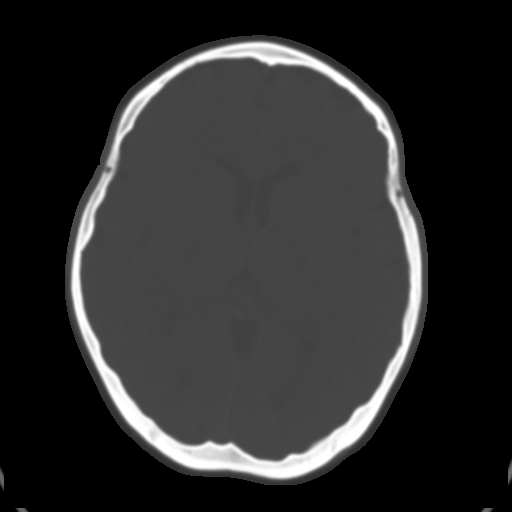
[im 19/34  brain]
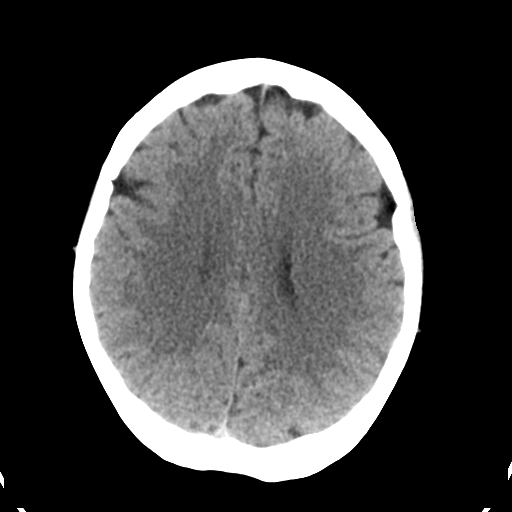
[im 22/34  brain]
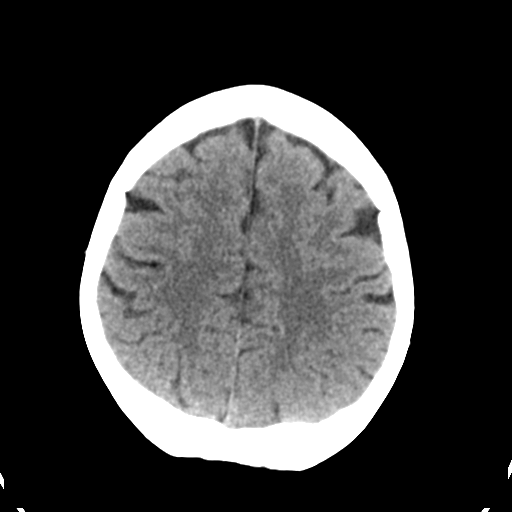
[im 26/34  brain]
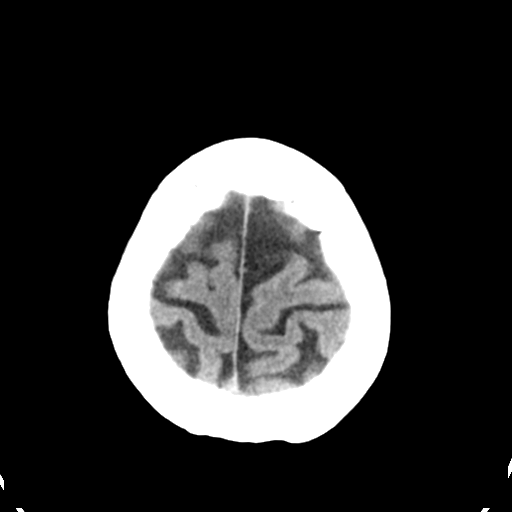
[im 28/34  brain]
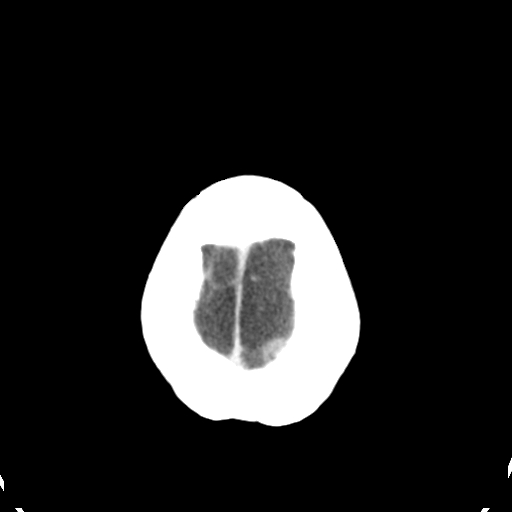
[im 28/34  bone]
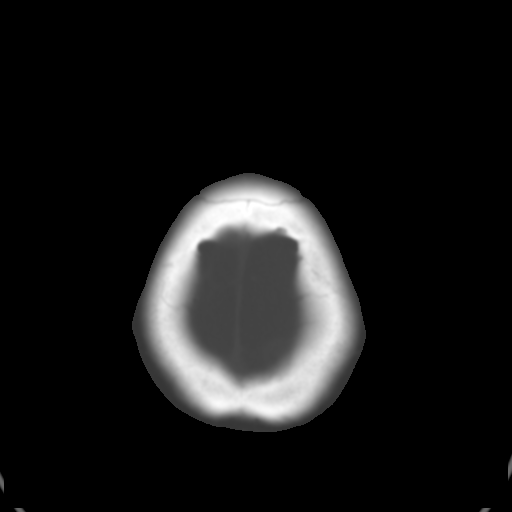
[im 31/34  brain]
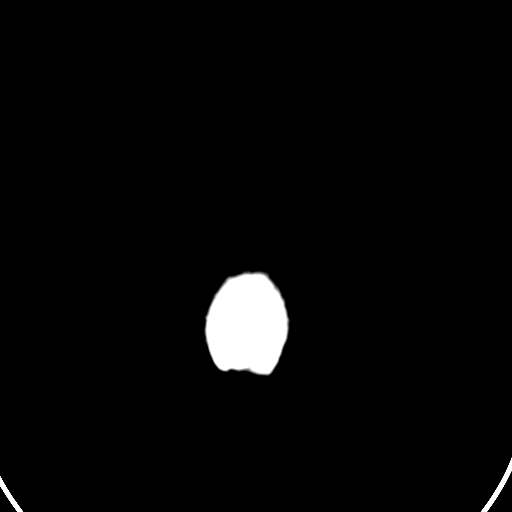

[Series 5: head 3.0 mpr cor · coronal · 0.30mm/px · 3 of 67 slices shown]
[im 23/67  brain]
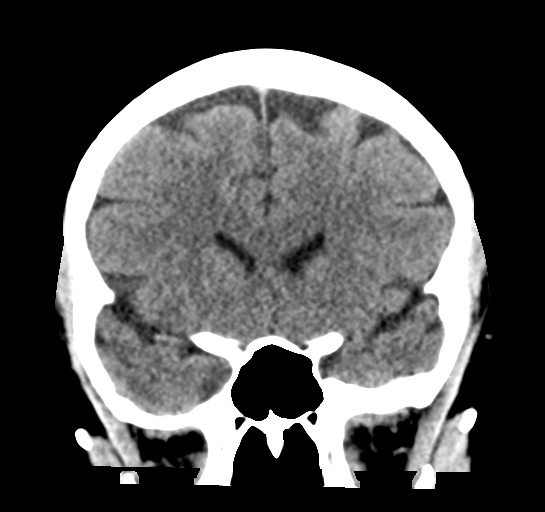
[im 30/67  brain]
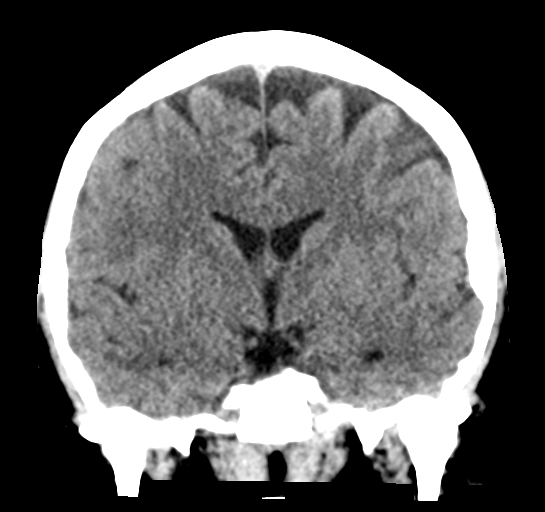
[im 37/67  brain]
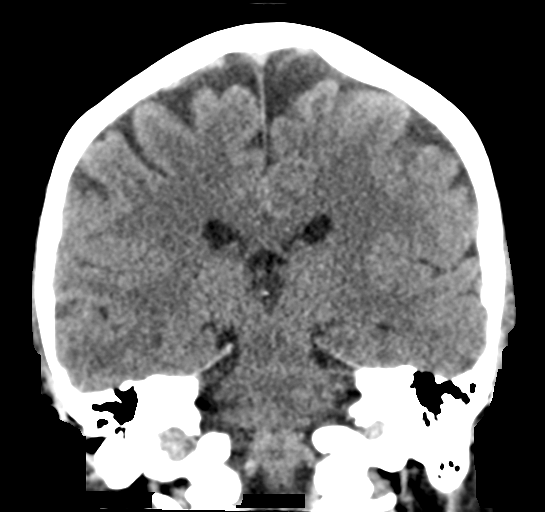

[Series 6: head 3.0 mpr sag · sagittal · 0.31mm/px · 3 of 57 slices shown]
[im 19/57  brain]
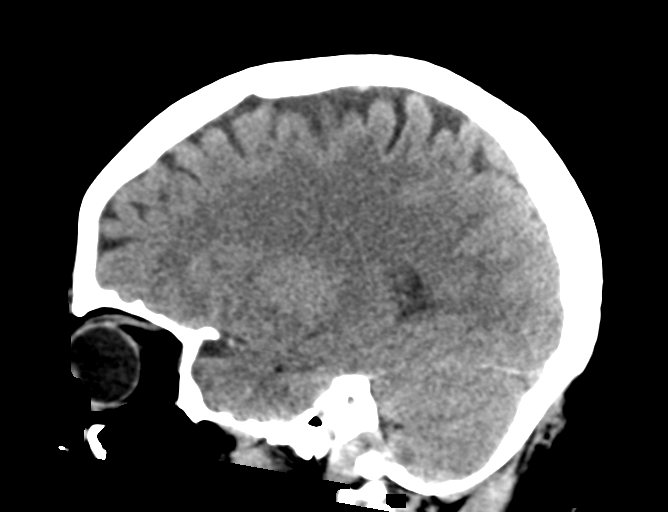
[im 29/57  brain]
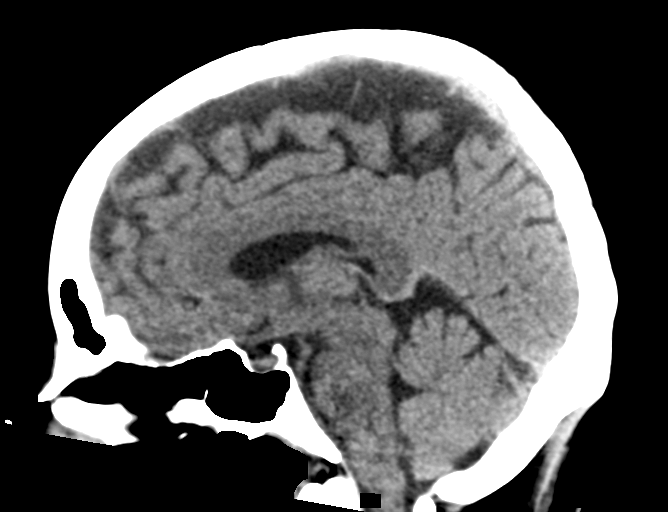
[im 38/57  brain]
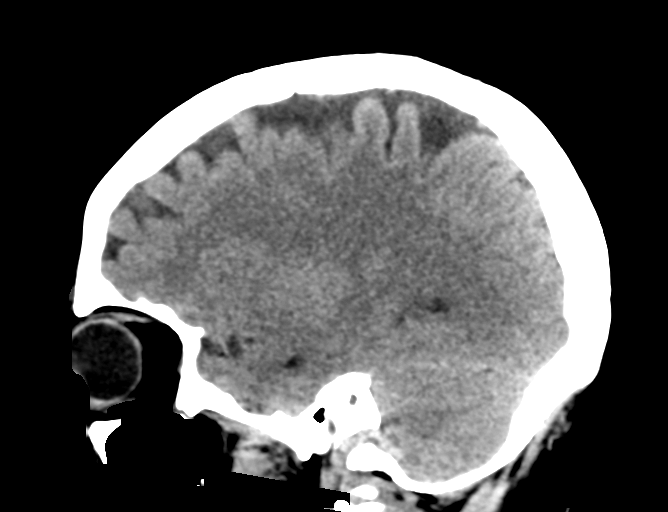

[16 of 47 positions shown; findings below may reference images not displayed]

FINDINGS: Brain: No evidence of acute infarction, hemorrhage, hydrocephalus,
extra-axial collection or mass lesion/mass effect.

Vascular: No hyperdense vessel identified.

Skull: No evidence of acute fracture.

Sinuses/Orbits: Clear visualized sinuses.  Unremarkable orbits.

Other: No mastoid effusions.
IMPRESSION: No evidence of acute intracranial abnormality.

## 2022-11-09 ENCOUNTER — Other Ambulatory Visit: Payer: Self-pay | Admitting: Family Medicine

## 2022-11-09 DIAGNOSIS — I10 Essential (primary) hypertension: Secondary | ICD-10-CM

## 2022-11-23 ENCOUNTER — Other Ambulatory Visit: Payer: Self-pay | Admitting: Family Medicine

## 2022-11-23 DIAGNOSIS — I10 Essential (primary) hypertension: Secondary | ICD-10-CM

## 2022-11-23 NOTE — Telephone Encounter (Unsigned)
Copied from CRM [id number]. Topic: General - Other >> Nov 23, 2022 10:16 AM Everette C wrote: Reason for CRM: Medication Refill - Medication: olmesartan-hydrochlorothiazide (BENICAR HCT) 20-12.5 MG tablet [[id number]]  Has the patient contacted their pharmacy? Yes.   (Agent: If no, request that the patient contact the pharmacy for the refill. If patient does not wish to contact the pharmacy document the reason why and proceed with request.) (Agent: If yes, when and what did the pharmacy advise?)  Preferred Pharmacy (with phone number or street name): [id number] G[place] Otto, [place] - [address] AT [facility] OF [facility] & [address] [place] [address] Phone: [phone] Fax: [phone] Hours: Not open 24 hours   Has the patient been seen for an appointment in the last year OR does the patient have an upcoming appointment? Yes.    Agent: Please be advised that RX refills may take up to 3 business days. We ask that you follow-up with your pharmacy.

## 2022-11-24 NOTE — Telephone Encounter (Signed)
Unable to refill per protocol, Rx request is too soon for refill. Last refill 10/29/22 for 90 days.  Requested Prescriptions  Pending Prescriptions Disp Refills   olmesartan-hydrochlorothiazide (BENICAR HCT) 20-12.5 MG tablet 90 tablet 0    Sig: Take 1 tablet by mouth daily.     Cardiovascular: ARB + Diuretic Combos Passed - 11/23/2022 10:24 AM      Passed - K in normal range and within 180 days    Potassium  Date Value Ref Range Status  10/14/2022 4.4 3.5 - 5.2 mmol/L Final         Passed - Na in normal range and within 180 days    Sodium  Date Value Ref Range Status  10/14/2022 140 134 - 144 mmol/L Final         Passed - Cr in normal range and within 180 days    Creat  Date Value Ref Range Status  03/24/2022 0.68 0.50 - 0.97 mg/dL Final   Creatinine, Ser  Date Value Ref Range Status  10/14/2022 0.86 0.57 - 1.00 mg/dL Final   Creatinine, Urine  Date Value Ref Range Status  03/29/2022 122 20 - 275 mg/dL Final         Passed - eGFR is 10 or above and within 180 days    GFR, Est African American  Date Value Ref Range Status  03/19/2020 131 > OR = 60 mL/min/1.13m2 Final   GFR, Est Non African American  Date Value Ref Range Status  03/19/2020 113 > OR = 60 mL/min/1.76m2 Final   eGFR  Date Value Ref Range Status  10/14/2022 91 >59 mL/min/1.73 Final         Passed - Patient is not pregnant      Passed - Last BP in normal range    BP Readings from Last 1 Encounters:  10/29/22 130/76         Passed - Valid encounter within last 6 months    Recent Outpatient Visits           3 weeks ago HTN (hypertension), benign   [facility] Alba Cory, MD   1 month ago HTN (hypertension), benign   [facility] Alba Cory, MD   4 months ago HTN (hypertension), benign   [facility] Alba Cory, MD   8 months ago Uncontrolled hypertension   [facility]  Alba Cory, MD   8 months ago Well adult exam   [facility] Alba Cory, MD       Future Appointments             In 1 month Alba Cory, MD [facility]   In 4 months Alba Cory, MD [facility]

## 2022-12-25 ENCOUNTER — Other Ambulatory Visit: Payer: Self-pay | Admitting: Family Medicine

## 2022-12-25 DIAGNOSIS — I10 Essential (primary) hypertension: Secondary | ICD-10-CM

## 2023-01-17 NOTE — Progress Notes (Unsigned)
Name: Krista [name]   MRN: [id number]    DOB: [birth year]   Date:01/18/2023       Progress Note  Subjective  Chief Complaint  Follow Up  HPI  HTN: She denies chest pain , palpitation or SOB. She has changed her diet, avoiding processed food, she has very little caffeine, drinking vegetables smoothies that she prepares at home. Eating smaller portions. Weight is stable since last visit.  She is maintaining her weight loss, BMI  is now below 30. She was on hydrochlorothiazide only but bp was still  mostly in the 140's range, we added Benicar 20 mg to the regiment 10/2022, she has been logging her readings and only once went 140/90, usually well controlled. No longer having hot flashes. All labs normal. Explained essential hypertension, keep checking intermittently. 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 She states she will try one more year and if unable to tolerate she will switch careers. She states this school year has been better - less stressful.  She is going to integrative psychiatric care in [place], negative for ADD and given reassurance She was diagnosed with depression as a teenager but never took medications   Overweight: BMI is below 30, continue life style modification   Patient Active Problem List   Diagnosis Date Noted   HTN (hypertension), benign 07/06/2022   Overweight (BMI 25.0-29.9) 07/06/2022   Alopecia areata 06/19/2014    No past surgical history on file.  Family History  Problem Relation Age of Onset   Diabetes Maternal Grandfather    Stomach cancer Paternal Grandmother     Social History   Tobacco Use   Smoking status: Never   Smokeless tobacco: Never  Substance Use Topics   Alcohol use: Yes    Alcohol/week: 1.0 standard drink of alcohol    Types: 1 Standard drinks or equivalent per week    Comment: 1 alcoholic drinks  per week     Current Outpatient Medications:    EPINEPHrine (EPIPEN 2-PAK) 0.3 mg/0.3 mL IJ SOAJ injection, Inject 0.3 mg into the muscle as needed for anaphylaxis., Disp: 2 each, Rfl: 1   norethindrone-ethinyl estradiol-FE (BLISOVI FE 1/20) 1-20 MG-MCG tablet, Take 1 tablet by mouth daily., Disp: 84 tablet, Rfl: 3   olmesartan-hydrochlorothiazide (BENICAR HCT) 20-12.5 MG tablet, Take 1 tablet by mouth daily., Disp: 90 tablet, Rfl: 0  Allergies  Allergen Reactions   Shellfish Allergy Anaphylaxis    I personally reviewed active problem list, medication list, allergies, family history, social history, health maintenance with the patient/caregiver today.   ROS  Ten systems reviewed and is negative except as mentioned in HPI    Objective  Vitals:   01/18/23 0801  BP: 126/78  Pulse: 93  Resp: 16  SpO2: 98%  Weight: 164 lb (74.4 kg)  Height: 5\' 3"  (1.6 m)    Body mass index is 29.05 kg/m.  Physical Exam  Constitutional: Patient appears well-developed and well-nourished.  No distress.  HEENT: head atraumatic, normocephalic, pupils equal and reactive to light, neck supple Cardiovascular: Normal rate, regular rhythm and normal heart sounds.  No murmur heard. No BLE edema. Pulmonary/Chest: Effort normal and breath sounds normal. No respiratory distress. Abdominal: Soft.  There is no tenderness. Psychiatric: Patient has a normal mood and affect. behavior is normal. Judgment and thought content normal.  PHQ2/9:    01/18/2023    8:01 AM 10/29/2022    9:25 AM 10/05/2022    2:48 PM 07/06/2022    8:20 AM 03/29/2022    2:16 PM  Depression screen PHQ 2/9  Decreased Interest 0 0 0 0 0  Down, Depressed, Hopeless 0 0 0 0 1  PHQ - 2 Score 0 0 0 0 1  Altered sleeping 0 0 0 0 3  Tired, decreased energy 0 0 0 0 3  Change in appetite 0 0 0 0 0  Feeling bad or failure about yourself  0 0 0 0 0  Trouble concentrating 0 0 0 0 0  Moving slowly or fidgety/restless 0 0 0 0 0  Suicidal  thoughts 0 0 0 0 0  PHQ-9 Score 0 0 0 0 7    phq 9 is negative   Fall Risk:    01/18/2023    8:01 AM 10/29/2022    9:24 AM 10/05/2022    2:47 PM 07/06/2022    8:20 AM 03/29/2022    2:15 PM  Fall Risk   Falls in the past year? 0 0 0 0 0  Number falls in past yr: 0 0  0 0  Injury with Fall? 0 0  0 0  Risk for fall due to : No Fall Risks No Fall Risks No Fall Risks No Fall Risks No Fall Risks  Follow up Falls prevention discussed Falls prevention discussed Falls prevention discussed Falls prevention discussed Falls prevention discussed      Functional Status Survey: Is the patient deaf or have difficulty hearing?: No Does the patient have difficulty seeing, even when wearing glasses/contacts?: No Does the patient have difficulty concentrating, remembering, or making decisions?: No Does the patient have difficulty walking or climbing stairs?: No Does the patient have difficulty dressing or bathing?: No Does the patient have difficulty doing errands alone such as visiting a doctor's office or shopping?: No    Assessment & Plan  1. HTN (hypertension), benign  - olmesartan-hydrochlorothiazide (BENICAR HCT) 20-12.5 MG tablet; Take 1 tablet by mouth daily.  Dispense: 90 tablet; Refill: 1  2. Overweight (BMI 25.0-29.9)   Continue life style medication

## 2023-01-18 ENCOUNTER — Encounter: Payer: Self-pay | Admitting: Family Medicine

## 2023-01-18 ENCOUNTER — Ambulatory Visit: Payer: BC Managed Care – PPO | Admitting: Family Medicine

## 2023-01-18 VITALS — BP 126/78 | HR 93 | Resp 16 | Ht 63.0 in | Wt 164.0 lb

## 2023-01-18 DIAGNOSIS — E663 Overweight: Secondary | ICD-10-CM | POA: Diagnosis not present

## 2023-01-18 DIAGNOSIS — I10 Essential (primary) hypertension: Secondary | ICD-10-CM | POA: Diagnosis not present

## 2023-01-18 MED ORDER — OLMESARTAN MEDOXOMIL-HCTZ 20-12.5 MG PO TABS: 1.0000 | ORAL_TABLET | Freq: Every day | ORAL | 1 refills | Status: DC

## 2023-01-22 ENCOUNTER — Other Ambulatory Visit: Payer: Self-pay | Admitting: Family Medicine

## 2023-01-22 DIAGNOSIS — I10 Essential (primary) hypertension: Secondary | ICD-10-CM

## 2023-02-11 ENCOUNTER — Other Ambulatory Visit: Payer: Self-pay | Admitting: Family Medicine

## 2023-02-11 DIAGNOSIS — Z3041 Encounter for surveillance of contraceptive pills: Secondary | ICD-10-CM

## 2023-02-15 ENCOUNTER — Other Ambulatory Visit: Payer: Self-pay

## 2023-02-15 DIAGNOSIS — Z3041 Encounter for surveillance of contraceptive pills: Secondary | ICD-10-CM

## 2023-02-15 MED ORDER — NORETHIN ACE-ETH ESTRAD-FE 1-20 MG-MCG PO TABS: 1.0000 | ORAL_TABLET | Freq: Every day | ORAL | 0 refills | Status: DC

## 2023-02-15 NOTE — Telephone Encounter (Signed)
Appt 11/4-

## 2023-03-29 ENCOUNTER — Ambulatory Visit (INDEPENDENT_AMBULATORY_CARE_PROVIDER_SITE_OTHER): Payer: 59 | Admitting: Family Medicine

## 2023-03-29 ENCOUNTER — Encounter: Payer: Self-pay | Admitting: Family Medicine

## 2023-03-29 VITALS — BP 118/72 | HR 98 | Resp 16 | Ht 63.0 in | Wt 170.0 lb

## 2023-03-29 DIAGNOSIS — Z131 Encounter for screening for diabetes mellitus: Secondary | ICD-10-CM | POA: Diagnosis not present

## 2023-03-29 DIAGNOSIS — Z3041 Encounter for surveillance of contraceptive pills: Secondary | ICD-10-CM

## 2023-03-29 DIAGNOSIS — Z79899 Other long term (current) drug therapy: Secondary | ICD-10-CM

## 2023-03-29 DIAGNOSIS — Z0000 Encounter for general adult medical examination without abnormal findings: Secondary | ICD-10-CM | POA: Diagnosis not present

## 2023-03-29 MED ORDER — NORETHIN ACE-ETH ESTRAD-FE 1-20 MG-MCG PO TABS: 1.0000 | ORAL_TABLET | Freq: Every day | ORAL | 3 refills | Status: DC

## 2023-03-29 NOTE — Progress Notes (Signed)
 Name: Krista [name]   MRN: [id number]    DOB: 01-31-1986   Date:03/29/2023       Progress Note  Subjective  Chief Complaint  Chief Complaint  Patient presents with   Annual Exam    HPI  Patient presents for annual CPE.  Diet: she has been drinking a lot of smoothies the vegetable type is balanced but fruit smoothies she adds juice  Exercise: discussed increase physical activity   Last Eye Exam: completed Last Dental Exam: completed  Flowsheet Row Office Visit from 03/29/20[facility] in [facility]  AUDIT-C Score 1      Depression: Phq 9 is  negative    03/29/2023    3:02 PM 01/18/2023    8:01 AM 10/29/2022    9:25 AM 10/05/2022    2:48 PM 07/06/2022    8:20 AM  Depression screen PHQ 2/9  Decreased Interest 0 0 0 0 0  Down, Depressed, Hopeless 0 0 0 0 0  PHQ - 2 Score 0 0 0 0 0  Altered sleeping 0 0 0 0 0  Tired, decreased energy 0 0 0 0 0  Change in appetite 0 0 0 0 0  Feeling bad or failure about yourself  0 0 0 0 0  Trouble concentrating 0 0 0 0 0  Moving slowly or fidgety/restless 0 0 0 0 0  Suicidal thoughts 0 0 0 0 0  PHQ-9 Score 0 0 0 0 0  Difficult doing work/chores Not difficult at all       Hypertension: BP Readings from Last 3 Encounters:  03/29/23 118/72  01/18/23 126/78  10/29/22 130/76   Obesity: Wt Readings from Last 3 Encounters:  03/29/23 170 lb (77.1 kg)  01/18/23 164 lb (74.4 kg)  10/29/22 164 lb (74.4 kg)   BMI Readings from Last 3 Encounters:  03/29/23 30.11 kg/m  01/18/23 29.05 kg/m  10/29/22 29.05 kg/m     Vaccines: reviewed with the patient.   Hep C Screening: completed STD testing and prevention (HIV/chl/gon/syphilis): N/A Intimate partner violence: negative screen  Sexual History :lack of libido - discussed ways to improve relationship  Menstrual History/LMP/Abnormal Bleeding: regular cycles every 3 months but spots in between also  Discussed importance of follow up if any post-menopausal bleeding:  not applicable  Incontinence Symptoms: negative for symptoms   Breast cancer:  - Last Mammogram: N/A - BRCA gene screening: one paternal aunt had breast cancer  Osteoporosis Prevention : Discussed high calcium and vitamin D supplementation, weight bearing exercises Bone density :not applicable   Cervical cancer screening: up-to-date  Skin cancer: Discussed monitoring for atypical lesions  Colorectal cancer: N/A   Lung cancer:  Low Dose CT Chest recommended if Age 36-80 years, 20 pack-year currently smoking OR have quit w/in 15years. Patient does not qualify for screen   ECG: 2023  Advanced Care Planning: A voluntary discussion about advance care planning including the explanation and discussion of advance directives.  Discussed health care proxy and Living will, and the patient was able to identify a health care proxy as fiance GLENWOOD Kuba .  Patient does not have a living will and power of attorney of health care   Patient Active Problem List   Diagnosis Date Noted   HTN (hypertension), benign 07/06/2022   Overweight (BMI 25.0-29.9) 07/06/2022   Alopecia areata 06/19/2014    History reviewed. No pertinent surgical history.  Family History  Problem Relation Age of Onset   Diabetes Maternal Grandfather  Stomach cancer Paternal Grandmother     Social History   Socioeconomic History   Marital status: Single    Spouse name: Not on file   Number of children: 0   Years of education: Not on file   Highest education level: Bachelor's degree (e.g., BA, AB, BS)  Occupational History   Occupation: Administrator, Arts: [employer]    Comment: school systerm   Tobacco Use   Smoking status: Never   Smokeless tobacco: Never  Vaping Use   Vaping status: Never Used  Substance and Sexual Activity   Alcohol use: Yes    Alcohol/week: 1.0 standard drink of alcohol    Types: 1 Standard drinks or equivalent per week    Comment: 1 alcoholic drinks per week   Drug use: No   Sexual  activity: Yes    Partners: Male    Birth control/protection: Pill, None  Other Topics Concern   Not on file  Social History Narrative   Lives with boyfriend since 2015   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 [place]    Parents from [place] but she was born in [place]   Social Drivers of Health   Financial Resource Strain: Low Risk  (03/29/2023)   Overall Financial Resource Strain (CARDIA)    Difficulty of Paying Living Expenses: Not hard at all  Food Insecurity: No Food Insecurity (03/29/2023)   Hunger Vital Sign    Worried About Running Out of Food in the Last Year: Never true    Ran Out of Food in the Last Year: Never true  Transportation Needs: No Transportation Needs (03/29/2023)   PRAPARE - Administrator, Civil Service (Medical): No    Lack of Transportation (Non-Medical): No  Physical Activity: Insufficiently Active (01/17/2023)   Exercise Vital Sign    Days of Exercise per Week: 4 days    Minutes of Exercise per Session: 30 min  Stress: Stress Concern Present (03/29/2023)   [employer] of Occupational Health - Occupational Stress Questionnaire    Feeling of Stress : To some extent  Social Connections: Socially Isolated (03/29/2023)   Social Connection and Isolation Panel [NHANES]    Frequency of Communication with Friends and Family: More than three times a week    Frequency of Social Gatherings with Friends and Family: More than three times a week    Attends Religious Services: Never    Database Administrator or Organizations: No    Attends Banker Meetings: Never    Marital Status: Never married  Intimate Partner Violence: Not At Risk (03/29/2023)   Humiliation, Afraid, Rape, and Kick questionnaire    Fear of Current or Ex-Partner: No    Emotionally Abused: No    Physically Abused: No    Sexually Abused: No     Current Outpatient Medications:     EPINEPHrine  (EPIPEN  2-PAK) 0.3 mg/0.3 mL IJ SOAJ injection, Inject 0.3 mg into the muscle as needed for anaphylaxis., Disp: 2 each, Rfl: 1   norethindrone-ethinyl estradiol-FE (BLISOVI FE 1/20) 1-20 MG-MCG tablet, Take 1 tablet by mouth daily., Disp: 84 tablet, Rfl: 0   olmesartan -hydrochlorothiazide  (BENICAR  HCT) 20-12.5 MG tablet, Take 1 tablet by mouth daily., Disp: 90 tablet, Rfl: 1  Allergies  Allergen Reactions   Shellfish Allergy Anaphylaxis     ROS  Ten systems reviewed and is negative except as mentioned in HPI  Objective  Vitals:   03/29/23 1505  BP: 118/72  Pulse: 98  Resp: 16  SpO2: 95%  Weight: 170 lb (77.1 kg)  Height: 5' 3 (1.6 m)    Body mass index is 30.11 kg/m.  Physical Exam  Constitutional: Patient appears well-developed and well-nourished. No distress.  HENT: Head: Normocephalic and atraumatic. Ears: B TMs ok, no erythema or effusion; Nose: Nose normal. Mouth/Throat: Oropharynx is clear and moist. No oropharyngeal exudate.  Eyes: Conjunctivae and EOM are normal. Pupils are equal, round, and reactive to light. No scleral icterus.  Neck: Normal range of motion. Neck supple. No JVD present. No thyromegaly present.  Cardiovascular: Normal rate, regular rhythm and normal heart sounds.  No murmur heard. No BLE edema. Pulmonary/Chest: Effort normal and breath sounds normal. No respiratory distress. Abdominal: Soft. Bowel sounds are normal, no distension. There is no tenderness. no masses Breast: no lumps or masses, no nipple discharge or rashes FEMALE GENITALIA:  Not done  RECTAL: not done  Musculoskeletal: Normal range of motion, no joint effusions. No gross deformities Neurological: he is alert and oriented to person, place, and time. No cranial nerve deficit. Coordination, balance, strength, speech and gait are normal.  Skin: Skin is warm and dry. No rash noted. No erythema.  Psychiatric: Patient has a normal mood and affect. behavior is normal.  Judgment and thought content normal.     Assessment & Plan  1. Well adult exam (Primary)  - Lipid panel - CBC with Differential/Platelet - COMPLETE METABOLIC PANEL WITH GFR  2. Encounter for surveillance of contraceptive pills  - norethindrone-ethinyl estradiol-FE (BLISOVI FE 1/20) 1-20 MG-MCG tablet; Take 1 tablet by mouth daily.  Dispense: 84 tablet; Refill: 3  3. Diabetes mellitus screening  - Hemoglobin A1c  4. Long-term use of high-risk medication  - Lipid panel - CBC with Differential/Platelet - COMPLETE METABOLIC PANEL WITH GFR   -USPSTF grade A and B recommendations reviewed with patient; age-appropriate recommendations, preventive care, screening tests, etc discussed and encouraged; healthy living encouraged; see AVS for patient education given to patient -Discussed importance of 150 minutes of physical activity weekly, eat two servings of fish weekly, eat one serving of tree nuts ( cashews, pistachios, pecans, almonds.SABRA) every other day, eat 6 servings of fruit/vegetables daily and drink plenty of water and avoid sweet beverages.   -Reviewed Health Maintenance: Yes.

## 2023-03-30 LAB — LIPID PANEL
Cholesterol: 171 mg/dL (ref <200)
HDL: 52 mg/dL (ref >=50)
LDL Cholesterol (Calc): 83 mg/dL
Non-HDL Cholesterol (Calc): 119 mg/dL (ref <130)
Total CHOL/HDL Ratio: 3.3 (calc) (ref <5.0)
Triglycerides: 272 mg/dL — ABNORMAL HIGH (ref <150)

## 2023-03-30 LAB — COMPLETE METABOLIC PANEL WITH GFR
AG Ratio: 1.4 (calc) (ref 1.0–2.5)
ALT: 7 U/L (ref 6–29)
AST: 8 U/L — ABNORMAL LOW (ref 10–30)
Albumin: 4.1 g/dL (ref 3.6–5.1)
Alkaline phosphatase (APISO): 65 U/L (ref 31–125)
BUN: 20 mg/dL (ref 7–25)
CO2: 27 mmol/L (ref 20–32)
Calcium: 9.6 mg/dL (ref 8.6–10.2)
Chloride: 100 mmol/L (ref 98–110)
Creat: 0.66 mg/dL (ref 0.50–0.97)
Globulin: 3 g/dL (ref 1.9–3.7)
Glucose, Bld: 101 mg/dL — ABNORMAL HIGH (ref 65–99)
Potassium: 4.2 mmol/L (ref 3.5–5.3)
Sodium: 138 mmol/L (ref 135–146)
Total Bilirubin: 0.2 mg/dL (ref 0.2–1.2)
Total Protein: 7.1 g/dL (ref 6.1–8.1)
eGFR: 117 mL/min/{1.73_m2} (ref >=60)

## 2023-03-30 LAB — CBC WITH DIFFERENTIAL/PLATELET
Absolute Lymphocytes: 3772 {cells}/uL (ref 850–3900)
Absolute Monocytes: 637 {cells}/uL (ref 200–950)
Basophils Absolute: 57 {cells}/uL (ref 0–200)
Basophils Relative: 0.6 %
Eosinophils Absolute: 190 {cells}/uL (ref 15–500)
Eosinophils Relative: 2 %
HCT: 36.5 % (ref 35.0–45.0)
Hemoglobin: 12 g/dL (ref 11.7–15.5)
MCH: 29.6 pg (ref 27.0–33.0)
MCHC: 32.9 g/dL (ref 32.0–36.0)
MCV: 90.1 fL (ref 80.0–100.0)
MPV: 9.9 fL (ref 7.5–12.5)
Monocytes Relative: 6.7 %
Neutro Abs: 4845 {cells}/uL (ref 1500–7800)
Neutrophils Relative %: 51 %
Platelets: 429 10*3/uL — ABNORMAL HIGH (ref 140–400)
RBC: 4.05 10*6/uL (ref 3.80–5.10)
RDW: 12.1 % (ref 11.0–15.0)
Total Lymphocyte: 39.7 %
WBC: 9.5 10*3/uL (ref 3.8–10.8)

## 2023-03-30 LAB — HEMOGLOBIN A1C
Hgb A1c MFr Bld: 5.6 %{Hb} (ref <5.7)
Mean Plasma Glucose: 114 mg/dL
eAG (mmol/L): 6.3 mmol/L

## 2023-07-27 ENCOUNTER — Ambulatory Visit (INDEPENDENT_AMBULATORY_CARE_PROVIDER_SITE_OTHER): Payer: Self-pay | Admitting: Family Medicine

## 2023-07-27 ENCOUNTER — Encounter: Payer: Self-pay | Admitting: Family Medicine

## 2023-07-27 VITALS — BP 118/76 | HR 97 | Resp 16 | Ht 63.0 in | Wt 170.7 lb

## 2023-07-27 DIAGNOSIS — E781 Pure hyperglyceridemia: Secondary | ICD-10-CM

## 2023-07-27 DIAGNOSIS — I10 Essential (primary) hypertension: Secondary | ICD-10-CM | POA: Diagnosis not present

## 2023-07-27 DIAGNOSIS — Z8639 Personal history of other endocrine, nutritional and metabolic disease: Secondary | ICD-10-CM

## 2023-07-27 DIAGNOSIS — Z91013 Allergy to seafood: Secondary | ICD-10-CM | POA: Diagnosis not present

## 2023-07-27 DIAGNOSIS — R21 Rash and other nonspecific skin eruption: Secondary | ICD-10-CM

## 2023-07-27 DIAGNOSIS — L308 Other specified dermatitis: Secondary | ICD-10-CM

## 2023-07-27 DIAGNOSIS — D75839 Thrombocytosis, unspecified: Secondary | ICD-10-CM

## 2023-07-27 MED ORDER — OLMESARTAN MEDOXOMIL-HCTZ 20-12.5 MG PO TABS: 1.0000 | ORAL_TABLET | Freq: Every day | ORAL | 3 refills | Status: DC

## 2023-07-27 MED ORDER — ZORYVE 0.15 % EX CREA: 1.0000 g | TOPICAL_CREAM | Freq: Every day | CUTANEOUS | 0 refills | Status: AC

## 2023-07-27 MED ORDER — TRIAMCINOLONE ACETONIDE 0.1 % EX CREA: 1.0000 | TOPICAL_CREAM | Freq: Two times a day (BID) | CUTANEOUS | 0 refills | Status: AC

## 2023-07-27 NOTE — Progress Notes (Signed)
 Name: Krista [name]   MRN: [id number]    DOB: 1985-06-16   Date:07/27/2023       Progress Note  Subjective  Chief Complaint  Chief Complaint  Patient presents with   Medical Management of Chronic Issues   Discussed the use of AI scribe software for clinical note transcription with the patient, who gave verbal consent to proceed.  History of Present Illness Krista [name] is a 38 year old female who presents for a six-month follow-up visit.  Her blood pressure is well-controlled with recent readings of 118/76 mmHg. She monitors her blood pressure at home without issues and is on a medication regimen of 20/12.5 mg.  She has developed a new rash on her hands that began about a week ago. The rash consists of small, fluid-filled vesicles that are pruritic and have spread to both hands. She has been applying cortisone cream but is uncertain of its effectiveness. She denies any new products or changes in her routine that could have caused the rash. The rash is localized to her hands.   She has a history of alopecia, which she associates with stress. She has not experienced a recent episode and has not consulted a dermatologist for this condition. In the past, she received steroid injections for treatment but prefers to avoid them now due to side effects.  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 Inhalation of shellfish does not trigger a reaction; ingestion is required for a reaction.  Her recent labs indicated elevated triglycerides, though she is unsure if she was fasting at the time. Her LDL cholesterol has improved, and her HDL cholesterol is at a protective level. She has a history of anemia and elevated white blood cell count, but recent labs showed normal levels. Her platelet count is slightly elevated.    Patient Active Problem List   Diagnosis Date Noted   HTN (hypertension), benign 07/06/2022   Overweight (BMI 25.0-29.9)  07/06/2022   Alopecia areata 06/19/2014    No past surgical history on file.  Family History  Problem Relation Age of Onset   Diabetes Maternal Grandfather    Stomach cancer Paternal Grandmother     Social History   Tobacco Use   Smoking status: Never   Smokeless tobacco: Never  Substance Use Topics   Alcohol use: Yes    Alcohol/week: 1.0 standard drink of alcohol    Types: 1 Standard drinks or equivalent per week    Comment: 1 alcoholic drinks per week     Current Outpatient Medications:    EPINEPHrine  (EPIPEN  2-PAK) 0.3 mg/0.3 mL IJ SOAJ injection, Inject 0.3 mg into the muscle as needed for anaphylaxis., Disp: 2 each, Rfl: 1   norethindrone-ethinyl estradiol-FE (BLISOVI FE 1/20) 1-20 MG-MCG tablet, Take 1 tablet by mouth daily., Disp: 84 tablet, Rfl: 3   olmesartan -hydrochlorothiazide  (BENICAR  HCT) 20-12.5 MG tablet, Take 1 tablet by mouth daily., Disp: 90 tablet, Rfl: 1  Allergies  Allergen Reactions   Shellfish Allergy Anaphylaxis    I personally reviewed active problem list, medication list, allergies, family history with the patient/caregiver today.   ROS  Ten systems reviewed and is negative except as mentioned in HPI    Objective Physical Exam  CONSTITUTIONAL: Patient appears well-developed and well-nourished. No distress. HEENT: Head atraumatic, normocephalic, neck supple. CARDIOVASCULAR: Normal rate, regular rhythm and normal heart sounds. No murmur heard. No BLE edema. PULMONARY: Effort normal and breath sounds normal. Lungs clear to auscultation. No respiratory distress. ABDOMINAL:  There is no tenderness or distention. MUSCULOSKELETAL: Normal gait. Without gross motor or sensory deficit. PSYCHIATRIC: Patient has a normal mood and affect. Behavior is normal. Judgment and thought content normal. SKIN: Rash with small fluid-filled lesions on hands, area on left thenar area has a large eczematous patch,  spreading, dry and cracking, mild  itchiness.  Vitals:   07/27/23 1526  BP: 118/76  Pulse: 97  Resp: 16  SpO2: 98%  Weight: 170 lb 11.2 oz (77.4 kg)  Height: 5\' 3"  (1.6 m)    Body mass index is 30.24 kg/m.   PHQ2/9:    07/27/2023    3:24 PM 03/29/2023    3:02 PM 01/18/2023    8:01 AM 10/29/2022    9:25 AM 10/05/2022    2:48 PM  Depression screen PHQ 2/9  Decreased Interest 0 0 0 0 0  Down, Depressed, Hopeless 0 0 0 0 0  PHQ - 2 Score 0 0 0 0 0  Altered sleeping  0 0 0 0  Tired, decreased energy  0 0 0 0  Change in appetite  0 0 0 0  Feeling bad or failure about yourself   0 0 0 0  Trouble concentrating  0 0 0 0  Moving slowly or fidgety/restless  0 0 0 0  Suicidal thoughts  0 0 0 0  PHQ-9 Score  0 0 0 0  Difficult doing work/chores  Not difficult at all       phq 9 is negative  Fall Risk:    07/27/2023    3:20 PM 03/29/2023    2:59 PM 01/18/2023    8:01 AM 10/29/2022    9:24 AM 10/05/2022    2:47 PM  Fall Risk   Falls in the past year? 0 0 0 0 0  Number falls in past yr: 0 0 0 0   Injury with Fall? 0 0 0 0   Risk for fall due to : No Fall Risks No Fall Risks No Fall Risks No Fall Risks No Fall Risks  Follow up Falls prevention discussed;Education provided;Falls evaluation completed Falls prevention discussed;Education provided;Falls evaluation completed Falls prevention discussed Falls prevention discussed Falls prevention discussed      Assessment & Plan Eczema New rash on hands with fluid-filled lesions and dryness, likely eczema. Rash is spreading and mildly pruritic. - Prescribe triamcinolone  cream for daytime use. - Zoryve sample and rx sent to pharmacy - Send prescriptions to [facility].  Essential (primary) hypertension Hypertension well-controlled with current medication. Blood pressure 118/76 mmHg. Lower stress levels may contribute to control. - Provide a one-year supply of current antihypertensive medication. - Schedule follow-up in July 2026. - Advise home blood pressure monitoring  and report any changes. - Consider adding a beta blocker if stress-related blood pressure issues arise.  Elevated triglycerides Previous labs showed elevated triglycerides. Discussed importance of fasting before lipid panel for accuracy. LDL improved, HDL protective. - Advise fasting for 8-12 hours before next lipid panel.  Allergy to shellfish Confirmed shellfish allergy. Ingestion causes reactions; no issues with inhalation. Carries an Epipen  for emergencies.  Alopecia No recent episodes. Previous episodes stress-related, treated with steroid injections causing hormonal side effects. Prefers to avoid steroid injections.

## 2023-07-28 ENCOUNTER — Telehealth: Payer: Self-pay | Admitting: Pharmacy Technician

## 2023-07-28 ENCOUNTER — Other Ambulatory Visit (HOSPITAL_COMMUNITY): Payer: Self-pay

## 2023-07-28 NOTE — Telephone Encounter (Signed)
 Pharmacy Patient Advocate Encounter  Received notification from [facility] that Prior Authorization for Zoryve 0.15% cream has been APPROVED from 07/28/23 to 10/26/23. Ran test claim, Copay is $30.00. This test claim was processed through [facility]- copay amounts may vary at other pharmacies due to pharmacy/plan contracts, or as the patient moves through the different stages of their insurance plan.   PA #/Case ID/Reference #: [id number]

## 2023-07-28 NOTE — Telephone Encounter (Signed)
 Pharmacy Patient Advocate Encounter   Received notification from Onbase that prior authorization for Zoryve 0.15% cream is required/requested.   Insurance verification completed.   The patient is insured through [facility] .   Per test claim: PA required; PA started via CoverMyMeds. KEY [id number] . Waiting for clinical questions to populate.

## 2023-07-31 ENCOUNTER — Emergency Department (HOSPITAL_BASED_OUTPATIENT_CLINIC_OR_DEPARTMENT_OTHER)
Admission: EM | Admit: 2023-07-31 | Discharge: 2023-08-01 | Disposition: A | Discharge status: Home / Self Care | Attending: Emergency Medicine | Admitting: Emergency Medicine

## 2023-07-31 ENCOUNTER — Encounter (HOSPITAL_BASED_OUTPATIENT_CLINIC_OR_DEPARTMENT_OTHER): Payer: Self-pay | Admitting: Emergency Medicine

## 2023-07-31 ENCOUNTER — Other Ambulatory Visit: Payer: Self-pay

## 2023-07-31 DIAGNOSIS — D649 Anemia, unspecified: Secondary | ICD-10-CM | POA: Insufficient documentation

## 2023-07-31 DIAGNOSIS — R112 Nausea with vomiting, unspecified: Secondary | ICD-10-CM | POA: Insufficient documentation

## 2023-07-31 LAB — CBC WITH DIFFERENTIAL/PLATELET
Abs Immature Granulocytes: 0.06 10*3/uL (ref 0.00–0.07)
Basophils Absolute: 0.1 10*3/uL (ref 0.0–0.1)
Basophils Relative: 0 %
Eosinophils Absolute: 0 10*3/uL (ref 0.0–0.5)
Eosinophils Relative: 0 %
HCT: 35.5 % — ABNORMAL LOW (ref 36.0–46.0)
Hemoglobin: 12 g/dL (ref 12.0–15.0)
Immature Granulocytes: 0 %
Lymphocytes Relative: 17 %
Lymphs Abs: 2.4 10*3/uL (ref 0.7–4.0)
MCH: 29.4 pg (ref 26.0–34.0)
MCHC: 33.8 g/dL (ref 30.0–36.0)
MCV: 87 fL (ref 80.0–100.0)
Monocytes Absolute: 0.5 10*3/uL (ref 0.1–1.0)
Monocytes Relative: 3 %
Neutro Abs: 10.8 10*3/uL — ABNORMAL HIGH (ref 1.7–7.7)
Neutrophils Relative %: 80 %
Platelets: 374 10*3/uL (ref 150–400)
RBC: 4.08 MIL/uL (ref 3.87–5.11)
RDW: 12.9 % (ref 11.5–15.5)
WBC: 13.8 10*3/uL — ABNORMAL HIGH (ref 4.0–10.5)
nRBC: 0 % (ref 0.0–0.2)

## 2023-07-31 LAB — COMPREHENSIVE METABOLIC PANEL WITH GFR
ALT: 9 U/L (ref 0–44)
AST: 15 U/L (ref 15–41)
Albumin: 4.3 g/dL (ref 3.5–5.0)
Alkaline Phosphatase: 63 U/L (ref 38–126)
Anion gap: 15 (ref 5–15)
BUN: 14 mg/dL (ref 6–20)
CO2: 22 mmol/L (ref 22–32)
Calcium: 9.5 mg/dL (ref 8.9–10.3)
Chloride: 99 mmol/L (ref 98–111)
Creatinine, Ser: 0.7 mg/dL (ref 0.44–1.00)
GFR, Estimated: >60 mL/min (ref >60)
Glucose, Bld: 108 mg/dL — ABNORMAL HIGH (ref 70–99)
Potassium: 4.3 mmol/L (ref 3.5–5.1)
Sodium: 136 mmol/L (ref 135–145)
Total Bilirubin: 0.4 mg/dL (ref 0.0–1.2)
Total Protein: 7.5 g/dL (ref 6.5–8.1)

## 2023-07-31 LAB — LIPASE, BLOOD: Lipase: 15 U/L (ref 11–51)

## 2023-07-31 MED ORDER — SODIUM CHLORIDE 0.9 % IV BOLUS
1000.0000 mL | Freq: Once | INTRAVENOUS | Status: AC
  Administered 2023-07-31: 1000 mL via INTRAVENOUS

## 2023-07-31 MED ORDER — ONDANSETRON HCL 4 MG/2ML IJ SOLN
4.0000 mg | Freq: Once | INTRAMUSCULAR | Status: AC
  Administered 2023-07-31: 4 mg via INTRAVENOUS
  Filled 2023-07-31: qty 2

## 2023-07-31 NOTE — ED Triage Notes (Signed)
 Pt reports she took a percocet this morning instead of Tylenol; since then she c/o NV; sts she cannot keep water down

## 2023-07-31 NOTE — ED Provider Notes (Signed)
 [name] Provider Note   CSN: [id number] Arrival date & time: 07/31/23  2100     History  Chief Complaint  Patient presents with   Emesis    Krista [name] is a 38 y.o. female.  Patient to ED with symptoms of nausea and vomiting since this morning. She got up feeling well, had breakfast, and took what she thought was a Tylenol for a headache. She mistakenly took a Percocet instead and symptoms started after this. She has had one BM that was loose. She feels her abdomen is bloated but so pain specifically. No hematemesis. No known sick contacts. No SOB, rash or throat swelling.  The history is provided by the patient. No language interpreter was used.  Emesis      Home Medications Prior to Admission medications   Medication Sig Start Date End Date Taking? Authorizing Provider  ondansetron  (ZOFRAN -ODT) 4 MG disintegrating tablet Take 1 tablet (4 mg total) by mouth every 8 (eight) hours as needed for nausea or vomiting. 08/01/23  Yes Merrit Waugh, PA-C  EPINEPHrine  (EPIPEN  2-PAK) 0.3 mg/0.3 mL IJ SOAJ injection Inject 0.3 mg into the muscle as needed for anaphylaxis. 03/19/20   Sowles, Krichna, MD  norethindrone-ethinyl estradiol-FE (BLISOVI FE 1/20) 1-20 MG-MCG tablet Take 1 tablet by mouth daily. 03/29/23   Sowles, Krichna, MD  olmesartan -hydrochlorothiazide  (BENICAR  HCT) 20-12.5 MG tablet Take 1 tablet by mouth daily. 07/27/23   Sowles, Krichna, MD  Roflumilast (ZORYVE) 0.15 % CREA Apply 1 g topically daily. 07/27/23   Sowles, Krichna, MD  triamcinolone  cream (KENALOG ) 0.1 % Apply 1 Application topically 2 (two) times daily. 07/27/23   Sowles, Krichna, MD      Allergies    Shellfish allergy    Review of Systems   Review of Systems  Gastrointestinal:  Positive for vomiting.    Physical Exam Updated Vital Signs BP (!) 129/92   Pulse 86   Temp (!) 97.2 F (36.2 C)   Resp 16   Ht 5\' 3"  (1.6 m)   Wt 77.4 kg   LMP  07/13/2023 (Exact Date)   SpO2 96%   BMI 30.24 kg/m  Physical Exam Constitutional:      Appearance: She is well-developed.  HENT:     Head: Normocephalic.  Cardiovascular:     Rate and Rhythm: Normal rate and regular rhythm.     Heart sounds: No murmur heard. Pulmonary:     Effort: Pulmonary effort is normal.     Breath sounds: Normal breath sounds. No wheezing, rhonchi or rales.  Abdominal:     General: Bowel sounds are normal. There is no distension.     Palpations: Abdomen is soft.     Tenderness: There is no abdominal tenderness. There is no guarding or rebound.  Musculoskeletal:        General: Normal range of motion.     Cervical back: Normal range of motion and neck supple.  Skin:    General: Skin is warm and dry.  Neurological:     General: No focal deficit present.     Mental Status: She is alert and oriented to person, place, and time.     ED Results / Procedures / Treatments   Labs (all labs ordered are listed, but only abnormal results are displayed) Labs Reviewed  COMPREHENSIVE METABOLIC PANEL WITH GFR - Abnormal; Notable for the following components:      Result Value   Glucose, Bld 108 (*)    All  other components within normal limits  CBC WITH DIFFERENTIAL/PLATELET - Abnormal; Notable for the following components:   WBC 13.8 (*)    HCT 35.5 (*)    Neutro Abs 10.8 (*)    All other components within normal limits  LIPASE, BLOOD  URINALYSIS, ROUTINE W REFLEX MICROSCOPIC  PREGNANCY, URINE   Results for orders placed or performed during the hospital encounter of 07/31/23  Lipase, blood   Collection Time: 07/31/23 11:03 PM  Result Value Ref Range   Lipase 15 11 - 51 U/L  Comprehensive metabolic panel   Collection Time: 07/31/23 11:03 PM  Result Value Ref Range   Sodium 136 135 - 145 mmol/L   Potassium 4.3 3.5 - 5.1 mmol/L   Chloride 99 98 - 111 mmol/L   CO2 22 22 - 32 mmol/L   Glucose, Bld 108 (H) 70 - 99 mg/dL   BUN 14 6 - 20 mg/dL   Creatinine,  Ser 4.03 0.44 - 1.00 mg/dL   Calcium 9.5 8.9 - 47.4 mg/dL   Total Protein 7.5 6.5 - 8.1 g/dL   Albumin 4.3 3.5 - 5.0 g/dL   AST 15 15 - 41 U/L   ALT 9 0 - 44 U/L   Alkaline Phosphatase 63 38 - 126 U/L   Total Bilirubin 0.4 0.0 - 1.2 mg/dL   GFR, Estimated >25 >95 mL/min   Anion gap 15 5 - 15  CBC with Differential   Collection Time: 07/31/23 11:03 PM  Result Value Ref Range   WBC 13.8 (H) 4.0 - 10.5 K/uL   RBC 4.08 3.87 - 5.11 MIL/uL   Hemoglobin 12.0 12.0 - 15.0 g/dL   HCT 63.8 (L) 75.6 - 43.3 %   MCV 87.0 80.0 - 100.0 fL   MCH 29.4 26.0 - 34.0 pg   MCHC 33.8 30.0 - 36.0 g/dL   RDW 29.5 18.8 - 41.6 %   Platelets 374 150 - 400 K/uL   nRBC 0.0 0.0 - 0.2 %   Neutrophils Relative % 80 %   Neutro Abs 10.8 (H) 1.7 - 7.7 K/uL   Lymphocytes Relative 17 %   Lymphs Abs 2.4 0.7 - 4.0 K/uL   Monocytes Relative 3 %   Monocytes Absolute 0.5 0.1 - 1.0 K/uL   Eosinophils Relative 0 %   Eosinophils Absolute 0.0 0.0 - 0.5 K/uL   Basophils Relative 0 %   Basophils Absolute 0.1 0.0 - 0.1 K/uL   Immature Granulocytes 0 %   Abs Immature Granulocytes 0.06 0.00 - 0.07 K/uL     EKG None  Radiology No results found.  Procedures Procedures    Medications Ordered in ED Medications  sodium chloride  0.9 % bolus 1,000 mL (0 mLs Intravenous Stopped 08/01/23 0015)  ondansetron  (ZOFRAN ) injection 4 mg (4 mg Intravenous Given 07/31/23 2316)    ED Course/ Medical Decision Making/ A&P                                 Medical Decision Making This patient presents to the ED for concern of vomiting, this involves an extensive number of treatment options, and is a complaint that carries with it a high risk of complications and morbidity.  The differential diagnosis includes PUD, cholecystitis, gastroenteritis, adverse medication reaction   Co morbidities that complicate the patient evaluation  History of anemia, allergies   Additional history obtained:  Additional history and/or information  obtained from chart review, notable for  No recent ED visits   Lab Tests:  I Ordered, and personally interpreted labs.  The pertinent results include:  WBC 13.8, normal hgb, normal plts; no electrolyte abnormalities    Imaging Studies ordered:  I ordered imaging studies including n/a I independently visualized and interpreted imaging which showed n/a I agree with the radiologist interpretation   Cardiac Monitoring:  The patient was maintained on a cardiac monitor.  I personally viewed and interpreted the cardiac monitored which showed an underlying rhythm of: n/a   Medicines ordered and prescription drug management:  I ordered medication including Zofran   for nausea Reevaluation of the patient after these medicines showed that the patient resolved I have reviewed the patients home medicines and have made adjustments as needed   Test Considered:  N/a   Critical Interventions:  N/a   Consultations Obtained:  I requested consultation with the n/a,  and discussed lab and imaging findings as well as pertinent plan - they recommend: n/a   Problem List / ED Course:  N, V since this morning after taking a Percocet. This was over 11 hours ago. Symptoms have persisted through the day. No symptoms of allergic reaction.    Reevaluation:  After the interventions noted above, I reevaluated the patient and found that they have :resolved   Social Determinants of Health:  Never a smoker   Disposition:  After consideration of the diagnostic results and the patients response to treatment, I feel that the patient would benefit from discharge home.   Amount and/or Complexity of Data Reviewed Labs: ordered.  Risk Prescription drug management.           Final Clinical Impression(s) / ED Diagnoses Final diagnoses:  Nausea and vomiting, unspecified vomiting type    Rx / DC Orders ED Discharge Orders          Ordered    ondansetron  (ZOFRAN -ODT) 4 MG  disintegrating tablet  Every 8 hours PRN        08/01/23 0020              Mandy Second, PA-C 08/01/23 0021    Curatolo, Adam, DO 08/01/23 0028

## 2023-08-01 LAB — URINALYSIS, ROUTINE W REFLEX MICROSCOPIC
Bilirubin Urine: NEGATIVE
Glucose, UA: NEGATIVE mg/dL
Ketones, ur: 40 mg/dL — AB
Leukocytes,Ua: NEGATIVE
Nitrite: NEGATIVE
Protein, ur: NEGATIVE mg/dL
Specific Gravity, Urine: >=1.03 (ref 1.005–1.030)
pH: 6 (ref 5.0–8.0)

## 2023-08-01 LAB — URINALYSIS, MICROSCOPIC (REFLEX)

## 2023-08-01 LAB — PREGNANCY, URINE: Preg Test, Ur: NEGATIVE

## 2023-08-01 MED ORDER — ONDANSETRON 4 MG PO TBDP: 4.0000 mg | ORAL_TABLET | Freq: Three times a day (TID) | ORAL | 0 refills | Status: DC | PRN

## 2023-08-01 NOTE — Discharge Instructions (Addendum)
 Take Zofran  for nausea as needed. Push fluids slowly and advance diet as tolerated. Follow up with your doctor if symptoms persist and return to the ED with any new or worsening symptoms at any time.

## 2023-08-02 LAB — URINE CULTURE: Culture: NO GROWTH

## 2023-08-07 ENCOUNTER — Other Ambulatory Visit: Payer: Self-pay | Admitting: Family Medicine

## 2023-08-07 DIAGNOSIS — I10 Essential (primary) hypertension: Secondary | ICD-10-CM

## 2024-04-02 NOTE — Patient Instructions (Signed)

## 2024-04-03 ENCOUNTER — Encounter (INDEPENDENT_AMBULATORY_CARE_PROVIDER_SITE_OTHER): Payer: Self-pay

## 2024-04-03 ENCOUNTER — Ambulatory Visit (INDEPENDENT_AMBULATORY_CARE_PROVIDER_SITE_OTHER): Payer: Self-pay | Admitting: Family Medicine

## 2024-04-03 ENCOUNTER — Other Ambulatory Visit (HOSPITAL_COMMUNITY)
Admission: RE | Admit: 2024-04-03 | Discharge: 2024-04-03 | Disposition: A | Discharge status: Home / Self Care | Source: Ambulatory Visit | Attending: Family Medicine | Admitting: Family Medicine

## 2024-04-03 VITALS — BP 114/76 | HR 71 | Resp 16 | Ht 62.13 in | Wt 173.7 lb

## 2024-04-03 DIAGNOSIS — Z79899 Other long term (current) drug therapy: Secondary | ICD-10-CM | POA: Diagnosis not present

## 2024-04-03 DIAGNOSIS — Z369 Encounter for antenatal screening, unspecified: Secondary | ICD-10-CM | POA: Insufficient documentation

## 2024-04-03 DIAGNOSIS — Z8639 Personal history of other endocrine, nutritional and metabolic disease: Secondary | ICD-10-CM | POA: Diagnosis not present

## 2024-04-03 DIAGNOSIS — Z131 Encounter for screening for diabetes mellitus: Secondary | ICD-10-CM | POA: Diagnosis not present

## 2024-04-03 DIAGNOSIS — E781 Pure hyperglyceridemia: Secondary | ICD-10-CM | POA: Diagnosis not present

## 2024-04-03 DIAGNOSIS — I10 Essential (primary) hypertension: Secondary | ICD-10-CM

## 2024-04-03 DIAGNOSIS — Z0000 Encounter for general adult medical examination without abnormal findings: Secondary | ICD-10-CM

## 2024-04-03 DIAGNOSIS — E66811 Obesity, class 1: Secondary | ICD-10-CM | POA: Diagnosis not present

## 2024-04-03 MED ORDER — AMLODIPINE BESYLATE 2.5 MG PO TABS: 2.5000 mg | ORAL_TABLET | Freq: Every day | ORAL | 1 refills | Status: AC

## 2024-04-03 MED ORDER — HYDROCHLOROTHIAZIDE 12.5 MG PO TABS: 12.5000 mg | ORAL_TABLET | Freq: Every day | ORAL | 1 refills | Status: AC

## 2024-04-03 NOTE — Progress Notes (Signed)
 Name: Krista [name]   MRN: [id number]    DOB: [birth year]   Date:04/03/2024       Progress Note  Subjective  Chief Complaint  Chief Complaint  Patient presents with   Annual Exam    HPI  Patient presents for annual CPE and follow up  Discussed the use of AI scribe software for clinical note transcription with the patient, who gave verbal consent to proceed.  History of Present Illness Krista [name] is a 39 year old female with hypertension who presents for prenatal counseling and management of hypertension.  She is planning to conceive and has stopped taking birth control two months ago. Her menstrual cycles have been regular since then, with the last menstrual period starting on March 27, 2024. She is actively monitoring her ovulation and menstrual cycles using her phone.  She has a history of hypertension and has been on olmesartan , which is not safe during pregnancy. . She monitors her blood pressure at home and reports it has been well-controlled.  She is actively managing her weight through lifestyle modifications, including portion control, avoiding junk food, and abstaining from alcohol. She is physically active, walking over 10,000 steps daily. Despite these efforts, her weight remains around 170 pounds, and her BMI is 31.64.  She has been in a stable relationship for 11 years and co-parents her partner's daughter. She and her partner are considering having a child together and are open to the possibility of pregnancy. She has not started taking prenatal vitamins but plans to do so.  Her social history includes no alcohol consumption and a balanced diet. She feels safe in her relationship and has one partner.  She has a history of eczema but does not require any creams at this time. She has no known family history of breast, ovarian, or colon cancer.      Diet: balanced  Exercise: very active, walks a lot   Last Eye Exam: completed Last Dental Exam:  completed  Flowsheet Row Office Visit from 04/03/2024 in [facility]  AUDIT-C Score 0   Depression: Phq 9 is  negative    04/03/2024    8:55 AM 07/27/2023    3:24 PM 03/29/2023    3:02 PM 01/18/2023    8:01 AM 10/29/2022    9:25 AM  Depression screen PHQ 2/9  Decreased Interest 0 0 0 0 0  Down, Depressed, Hopeless 0 0 0 0 0  PHQ - 2 Score 0 0 0 0 0  Altered sleeping   0 0 0  Tired, decreased energy   0 0 0  Change in appetite   0 0 0  Feeling bad or failure about yourself    0 0 0  Trouble concentrating   0 0 0  Moving slowly or fidgety/restless   0 0 0  Suicidal thoughts   0 0 0  PHQ-9 Score   0  0  0   Difficult doing work/chores   Not difficult at all       Data saved with a previous flowsheet row definition   Hypertension: BP Readings from Last 3 Encounters:  04/03/24 114/76  07/31/23 (!) 129/92  07/27/23 118/76   Obesity: Wt Readings from Last 3 Encounters:  04/03/24 173 lb 11.2 oz (78.8 kg)  07/31/23 170 lb 11.2 oz (77.4 kg)  07/27/23 170 lb 11.2 oz (77.4 kg)   BMI Readings from Last 3 Encounters:  04/03/24 31.64 kg/m  07/31/23 30.24 kg/m  07/27/23 30.24 kg/m  Vaccines: reviewed with the patient.   Hep C Screening: completed STD testing and prevention (HIV/chl/gon/syphilis): today  Intimate partner violence: negative screen  Sexual History : one partner, together for the past 11 years  Menstrual History/LMP/Abnormal Bleeding: regular cycles  Discussed importance of follow up if any post-menopausal bleeding: not applicable  Incontinence Symptoms: negative for symptoms   Breast cancer:  - Last Mammogram: NA - BRCA gene screening: N/A  Osteoporosis Prevention : Discussed high calcium and vitamin D  supplementation, weight bearing exercises Bone density :not applicable   Cervical cancer screening: up-to-date  Skin cancer: Discussed monitoring for atypical lesions  Colorectal cancer: N/A   Lung cancer:  Low Dose CT Chest  recommended if Age 60-80 years, 20 pack-year currently smoking OR have quit w/in 15years. Patient does not qualify for screen   ECG: 2023  Advanced Care Planning: A voluntary discussion about advance care planning including the explanation and discussion of advance directives.  Discussed health care proxy and Living will, and the patient was able to identify a health care proxy as significant other .  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 No pertinent surgical history.  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[employer]    Comment: school systerm   Tobacco Use   Smoking status: Never   Smokeless tobacco: Never  Vaping Use   Vaping status: Never Used  Substance and Sexual Activity   Alcohol use: Not Currently    Alcohol/week: 1.0 standard drink of alcohol    Comment: I dont drink anymore   Drug use: Never   Sexual activity: Yes    Partners: Male    Birth control/protection: Condom  Other Topics Concern   Not on file  Social History Narrative   Lives with boyfriend since 2015   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 [place]    Parents from [place] but she was born in [place]   Social Drivers of Health   Tobacco Use: Low Risk (04/03/2024)   Patient History    Smoking Tobacco Use: Never     Smokeless Tobacco Use: Never    Passive Exposure: Not on file  Financial Resource Strain: Low Risk (04/03/2024)   Overall Financial Resource Strain (CARDIA)    Difficulty of Paying Living Expenses: Not hard at all  Food Insecurity: No Food Insecurity (04/03/2024)   Epic    Worried About Radiation Protection Practitioner of Food in the Last Year: Never true    Ran Out of Food in the Last Year: Never true  Transportation Needs: No Transportation Needs (04/03/2024)   Epic    Lack of Transportation (Medical): No    Lack of Transportation (Non-Medical): No  Physical Activity: Insufficiently Active (04/03/2024)  Exercise Vital Sign    Days of Exercise per Week: 3 days    Minutes of Exercise per Session: 20 min  Stress: No Stress Concern Present (04/03/2024)   [employer] of Occupational Health - Occupational Stress Questionnaire    Feeling of Stress: Only a little  Social Connections: Socially Isolated (04/03/2024)   Social Connection and Isolation Panel    Frequency of Communication with Friends and Family: Twice a week    Frequency of Social Gatherings with Friends and Family: Never    Attends Religious Services: Never    Database Administrator or Organizations: No    Attends Engineer, Structural: Not on file    Marital Status: Living with partner  Intimate Partner Violence: Not At Risk (04/03/2024)   Epic    Fear of Current or Ex-Partner: No    Emotionally Abused: No    Physically Abused: No    Sexually Abused: No  Depression (PHQ2-9): Low Risk (04/03/2024)   Depression (PHQ2-9)    PHQ-2 Score: 0  Alcohol Screen: Low Risk (04/03/2024)   Alcohol Screen    Last Alcohol Screening Score (AUDIT): 0  Housing: Low Risk (04/03/2024)   Epic    Unable to Pay for Housing in the Last Year: No    Number of Times Moved in the Last Year: 0    Homeless in the Last Year: No  Utilities: Not At Risk (04/03/2024)   Epic    Threatened with loss of utilities: No  Health Literacy: Adequate Health Literacy  (04/03/2024)   B1300 Health Literacy    Frequency of need for help with medical instructions: Never    Current Medications[1]  Allergies[2]   ROS  Ten systems reviewed and is negative except as mentioned in HPI    Objective  Vitals:   04/03/24 0901  BP: 114/76  Pulse: 71  Resp: 16  SpO2: 99%  Weight: 173 lb 11.2 oz (78.8 kg)  Height: 5' 2.13 (1.578 m)    Body mass index is 31.64 kg/m.  Physical Exam  Constitutional: Patient appears well-developed and well-nourished. No distress.  HENT: Head: Normocephalic and atraumatic. Ears: B TMs ok, no erythema or effusion; Nose: Nose normal. Mouth/Throat: Oropharynx is clear and moist. No oropharyngeal exudate.  Eyes: Conjunctivae and EOM are normal. Pupils are equal, round, and reactive to light. No scleral icterus.  Neck: Normal range of motion. Neck supple. No JVD present. No thyromegaly present.  Cardiovascular: Normal rate, regular rhythm and normal heart sounds.  No murmur heard. No BLE edema. Pulmonary/Chest: Effort normal and breath sounds normal. No respiratory distress. Abdominal: Soft. Bowel sounds are normal, no distension. There is no tenderness. no masses Breast: no lumps or masses, no nipple discharge or rashes FEMALE GENITALIA:  Not done  RECTAL: not done  Musculoskeletal: Normal range of motion, no joint effusions. No gross deformities Neurological: he is alert and oriented to person, place, and time. No cranial nerve deficit. Coordination, balance, strength, speech and gait are normal.  Skin: Skin is warm and dry. No rash noted. No erythema.  Psychiatric: Patient has a normal mood and affect. behavior is normal. Judgment and thought content normal.     Assessment & Plan Well woman/Preconception counseling and antenatal screening Discussed ovulation tracking, prenatal vitamins with folic acid, and potential fertility evaluation. Reviewed safe medications during pregnancy and STD screening. - Start prenatal  vitamins with folic acid and iron  - Monitor ovulation and time intercourse accordingly. - Consider fertility evaluation if not  pregnant in six months. - Continue amlodipine  and HCTZ; switch to methadone if pregnant. - Ordered STD screening including HIV, syphilis, chlamydia, and gonorrhea. - Checked vitamin D  and B12 levels.  Obesity, class 1 BMI 31.64. Actively managing weight through lifestyle modifications. Not interested in pharmacological interventions due to pregnancy plans. - Referred to medical management for weight loss. - Continue lifestyle modifications including portion control and physical activity.  Essential hypertension Blood pressure well-controlled. Current medication olmesartan  not safe during pregnancy. Plan to switch to safer alternatives. - Discontinued olmesartan . - Prescribed amlodipine  2.5 mg daily. - Prescribed HCTZ 12.5 mg daily. - Monitor blood pressure at home and report if not controlled.  Hypertriglyceridemia Discussed importance of monitoring and managing triglyceride levels, especially in pregnancy planning. - Checked triglyceride levels.  History of iron deficiency Discussed importance of iron supplementation, especially in pregnancy planning. - Checked iron levels. - Start prenatal vitamins with iron.  Fibrocystic breast Benign fibrocystic breast changes present. - Continue routine monitoring.      -USPSTF grade A and B recommendations reviewed with patient; age-appropriate recommendations, preventive care, screening tests, etc discussed and encouraged; healthy living encouraged; see AVS for patient education given to patient -Discussed importance of 150 minutes of physical activity weekly, eat two servings of fish weekly, eat one serving of tree nuts ( cashews, pistachios, pecans, almonds.SABRA) every other day, eat 6 servings of fruit/vegetables daily and drink plenty of water and avoid sweet beverages.   -Reviewed Health Maintenance: Yes.        [1]  Current Outpatient Medications:    EPINEPHrine  (EPIPEN  2-PAK) 0.3 mg/0.3 mL IJ SOAJ injection, Inject 0.3 mg into the muscle as needed for anaphylaxis., Disp: 2 each, Rfl: 1   olmesartan -hydrochlorothiazide  (BENICAR  HCT) 20-12.5 MG tablet, Take 1 tablet by mouth daily., Disp: 90 tablet, Rfl: 3   Roflumilast  (ZORYVE ) 0.15 % CREA, Apply 1 g topically daily., Disp: 60 g, Rfl: 0   triamcinolone  cream (KENALOG ) 0.1 %, Apply 1 Application topically 2 (two) times daily., Disp: 30 g, Rfl: 0   norethindrone-ethinyl estradiol-FE (BLISOVI FE 1/20) 1-20 MG-MCG tablet, Take 1 tablet by mouth daily., Disp: 84 tablet, Rfl: 3   ondansetron  (ZOFRAN -ODT) 4 MG disintegrating tablet, Take 1 tablet (4 mg total) by mouth every 8 (eight) hours as needed for nausea or vomiting., Disp: 10 tablet, Rfl: 0 [2]  Allergies Allergen Reactions   Shellfish Allergy Anaphylaxis

## 2024-04-04 LAB — CBC WITH DIFFERENTIAL/PLATELET
Absolute Lymphocytes: 2982 {cells}/uL (ref 850–3900)
Absolute Monocytes: 518 {cells}/uL (ref 200–950)
Basophils Absolute: 71 {cells}/uL (ref 0–200)
Basophils Relative: 1 %
Eosinophils Absolute: 149 {cells}/uL (ref 15–500)
Eosinophils Relative: 2.1 %
HCT: 41.3 % (ref 35.9–46.0)
Hemoglobin: 13.1 g/dL (ref 11.7–15.5)
MCH: 28.9 pg (ref 27.0–33.0)
MCHC: 31.7 g/dL (ref 31.6–35.4)
MCV: 91.2 fL (ref 81.4–101.7)
MPV: 9.7 fL (ref 7.5–12.5)
Monocytes Relative: 7.3 %
Neutro Abs: 3380 {cells}/uL (ref 1500–7800)
Neutrophils Relative %: 47.6 %
Platelets: 473 Thousand/uL — ABNORMAL HIGH (ref 140–400)
RBC: 4.53 Million/uL (ref 3.80–5.10)
RDW: 12.8 % (ref 11.0–15.0)
Total Lymphocyte: 42 %
WBC: 7.1 Thousand/uL (ref 3.8–10.8)

## 2024-04-04 LAB — CERVICOVAGINAL ANCILLARY ONLY
Bacterial Vaginitis (gardnerella): NEGATIVE
Candida Glabrata: NEGATIVE
Candida Vaginitis: NEGATIVE
Chlamydia: NEGATIVE
Comment: NEGATIVE
Comment: NEGATIVE
Comment: NEGATIVE
Comment: NEGATIVE
Comment: NEGATIVE
Comment: NORMAL
Neisseria Gonorrhea: NEGATIVE
Trichomonas: NEGATIVE

## 2024-04-04 LAB — LIPID PANEL
Cholesterol: 176 mg/dL
HDL: 53 mg/dL
LDL Cholesterol (Calc): 97 mg/dL
Non-HDL Cholesterol (Calc): 123 mg/dL
Total CHOL/HDL Ratio: 3.3 (calc)
Triglycerides: 159 mg/dL — ABNORMAL HIGH

## 2024-04-04 LAB — COMPREHENSIVE METABOLIC PANEL WITH GFR
AG Ratio: 1.6 (calc) (ref 1.0–2.5)
ALT: 12 U/L (ref 6–29)
AST: 12 U/L (ref 10–30)
Albumin: 4.6 g/dL (ref 3.6–5.1)
Alkaline phosphatase (APISO): 85 U/L (ref 31–125)
BUN: 16 mg/dL (ref 7–25)
CO2: 28 mmol/L (ref 20–32)
Calcium: 9.4 mg/dL (ref 8.6–10.2)
Chloride: 104 mmol/L (ref 98–110)
Creat: 0.76 mg/dL (ref 0.50–0.97)
Globulin: 2.8 g/dL (ref 1.9–3.7)
Glucose, Bld: 84 mg/dL (ref 65–99)
Potassium: 4.8 mmol/L (ref 3.5–5.3)
Sodium: 138 mmol/L (ref 135–146)
Total Bilirubin: 0.4 mg/dL (ref 0.2–1.2)
Total Protein: 7.4 g/dL (ref 6.1–8.1)
eGFR: 104 mL/min/1.73m2

## 2024-04-04 LAB — IRON,TIBC AND FERRITIN PANEL
%SAT: 25 % (ref 16–45)
Ferritin: 36 ng/mL (ref 16–154)
Iron: 86 ug/dL (ref 40–190)
TIBC: 347 ug/dL (ref 250–450)

## 2024-04-04 LAB — VITAMIN D 25 HYDROXY (VIT D DEFICIENCY, FRACTURES): Vit D, 25-Hydroxy: 19 ng/mL — ABNORMAL LOW (ref 30–100)

## 2024-04-04 LAB — HIV ANTIBODY (ROUTINE TESTING W REFLEX)
HIV 1&2 Ab, 4th Generation: NONREACTIVE
HIV FINAL INTERPRETATION: NEGATIVE

## 2024-04-04 LAB — SYPHILIS: RPR W/REFLEX TO RPR TITER AND TREPONEMAL ANTIBODIES, TRADITIONAL SCREENING AND DIAGNOSIS ALGORITHM: RPR Ser Ql: NONREACTIVE

## 2024-04-04 LAB — HEMOGLOBIN A1C
Hgb A1c MFr Bld: 5.5 %
Mean Plasma Glucose: 111 mg/dL
eAG (mmol/L): 6.2 mmol/L

## 2024-04-04 LAB — B12 AND FOLATE PANEL
Folate: 7.9 ng/mL
Vitamin B-12: 526 pg/mL (ref 200–1100)

## 2024-04-06 ENCOUNTER — Ambulatory Visit: Payer: Self-pay | Admitting: Family Medicine

## 2024-10-18 ENCOUNTER — Ambulatory Visit: Admitting: Family Medicine

## 2025-04-08 ENCOUNTER — Encounter: Admitting: Family Medicine
